# Patient Record
Sex: Female | Born: 1945 | Race: White | Hispanic: No | Marital: Married | Smoking: Never smoker
Health system: Southern US, Community
[De-identification: ages and names within clinical notes are randomized; demographics above are authoritative.]

## PROBLEM LIST (undated history)

## (undated) DIAGNOSIS — M1990 Unspecified osteoarthritis, unspecified site: Secondary | ICD-10-CM

## (undated) DIAGNOSIS — D649 Anemia, unspecified: Secondary | ICD-10-CM

## (undated) DIAGNOSIS — R760 Raised antibody titer: Secondary | ICD-10-CM

## (undated) DIAGNOSIS — K4090 Unilateral inguinal hernia, without obstruction or gangrene, not specified as recurrent: Principal | ICD-10-CM

## (undated) DIAGNOSIS — Z86718 Personal history of other venous thrombosis and embolism: Secondary | ICD-10-CM

## (undated) HISTORY — DX: Anemia, unspecified: D64.9

## (undated) HISTORY — PX: FOOT SURGERY: SHX648

## (undated) HISTORY — DX: Personal history of other venous thrombosis and embolism: Z86.718

## (undated) HISTORY — DX: Unilateral inguinal hernia, without obstruction or gangrene, not specified as recurrent: K40.90

## (undated) HISTORY — PX: TUBAL LIGATION: SHX77

## (undated) HISTORY — DX: Unspecified osteoarthritis, unspecified site: M19.90

## (undated) HISTORY — DX: Raised antibody titer: R76.0

---

## 1999-06-23 ENCOUNTER — Other Ambulatory Visit: Admission: RE | Admit: 1999-06-23 | Discharge: 1999-06-23 | Payer: Self-pay | Admitting: Family Medicine

## 2000-07-06 ENCOUNTER — Encounter: Admission: RE | Admit: 2000-07-06 | Discharge: 2000-07-06 | Payer: Self-pay | Admitting: Family Medicine

## 2000-07-06 ENCOUNTER — Encounter: Payer: Self-pay | Admitting: Family Medicine

## 2000-08-12 ENCOUNTER — Other Ambulatory Visit: Admission: RE | Admit: 2000-08-12 | Discharge: 2000-08-12 | Payer: Self-pay | Admitting: Family Medicine

## 2000-10-14 ENCOUNTER — Observation Stay (HOSPITAL_COMMUNITY): Admission: AD | Admit: 2000-10-14 | Discharge: 2000-10-15 | Payer: Self-pay | Admitting: Internal Medicine

## 2001-08-11 ENCOUNTER — Encounter: Admission: RE | Admit: 2001-08-11 | Discharge: 2001-08-11 | Payer: Self-pay | Admitting: Internal Medicine

## 2001-08-11 ENCOUNTER — Encounter: Payer: Self-pay | Admitting: Internal Medicine

## 2002-03-19 ENCOUNTER — Other Ambulatory Visit: Admission: RE | Admit: 2002-03-19 | Discharge: 2002-03-19 | Payer: Self-pay | Admitting: Internal Medicine

## 2002-05-27 ENCOUNTER — Encounter: Payer: Self-pay | Admitting: Emergency Medicine

## 2002-05-27 ENCOUNTER — Emergency Department (HOSPITAL_COMMUNITY): Admission: EM | Admit: 2002-05-27 | Discharge: 2002-05-27 | Payer: Self-pay | Admitting: Emergency Medicine

## 2002-08-17 ENCOUNTER — Encounter: Admission: RE | Admit: 2002-08-17 | Discharge: 2002-08-17 | Payer: Self-pay | Admitting: Internal Medicine

## 2002-08-17 ENCOUNTER — Encounter: Payer: Self-pay | Admitting: Internal Medicine

## 2004-03-13 ENCOUNTER — Encounter: Admission: RE | Admit: 2004-03-13 | Discharge: 2004-03-13 | Payer: Self-pay | Admitting: Internal Medicine

## 2005-02-04 ENCOUNTER — Ambulatory Visit: Payer: Self-pay | Admitting: Internal Medicine

## 2005-04-28 ENCOUNTER — Ambulatory Visit: Payer: Self-pay | Admitting: Internal Medicine

## 2005-04-30 ENCOUNTER — Ambulatory Visit: Payer: Self-pay | Admitting: Internal Medicine

## 2005-05-04 ENCOUNTER — Ambulatory Visit: Payer: Self-pay | Admitting: Internal Medicine

## 2005-09-03 ENCOUNTER — Encounter: Admission: RE | Admit: 2005-09-03 | Discharge: 2005-09-03 | Payer: Self-pay | Admitting: Internal Medicine

## 2005-09-21 ENCOUNTER — Ambulatory Visit (HOSPITAL_COMMUNITY): Admission: RE | Admit: 2005-09-21 | Discharge: 2005-09-21 | Payer: Self-pay | Admitting: Orthopedic Surgery

## 2005-09-21 ENCOUNTER — Ambulatory Visit (HOSPITAL_BASED_OUTPATIENT_CLINIC_OR_DEPARTMENT_OTHER): Admission: RE | Admit: 2005-09-21 | Discharge: 2005-09-21 | Payer: Self-pay | Admitting: Orthopedic Surgery

## 2005-09-21 ENCOUNTER — Encounter (INDEPENDENT_AMBULATORY_CARE_PROVIDER_SITE_OTHER): Payer: Self-pay | Admitting: *Deleted

## 2005-10-25 ENCOUNTER — Ambulatory Visit: Payer: Self-pay | Admitting: Internal Medicine

## 2005-11-08 ENCOUNTER — Encounter: Payer: Self-pay | Admitting: Internal Medicine

## 2005-11-08 ENCOUNTER — Other Ambulatory Visit: Admission: RE | Admit: 2005-11-08 | Discharge: 2005-11-08 | Payer: Self-pay | Admitting: Internal Medicine

## 2005-11-08 ENCOUNTER — Ambulatory Visit: Payer: Self-pay | Admitting: Internal Medicine

## 2005-12-08 ENCOUNTER — Ambulatory Visit: Payer: Self-pay | Admitting: Family Medicine

## 2005-12-16 ENCOUNTER — Ambulatory Visit: Payer: Self-pay | Admitting: Internal Medicine

## 2005-12-29 ENCOUNTER — Encounter (INDEPENDENT_AMBULATORY_CARE_PROVIDER_SITE_OTHER): Payer: Self-pay | Admitting: Specialist

## 2005-12-29 ENCOUNTER — Encounter: Payer: Self-pay | Admitting: Internal Medicine

## 2005-12-29 ENCOUNTER — Ambulatory Visit: Payer: Self-pay | Admitting: Internal Medicine

## 2006-05-09 ENCOUNTER — Ambulatory Visit: Payer: Self-pay | Admitting: Internal Medicine

## 2006-08-16 ENCOUNTER — Ambulatory Visit: Payer: Self-pay | Admitting: Internal Medicine

## 2006-09-30 ENCOUNTER — Ambulatory Visit: Payer: Self-pay | Admitting: Internal Medicine

## 2006-09-30 LAB — CONVERTED CEMR LAB
ALT: 18 units/L (ref 0–40)
AST: 16 units/L (ref 0–37)
Albumin: 3.4 g/dL — ABNORMAL LOW (ref 3.5–5.2)
Alkaline Phosphatase: 61 units/L (ref 39–117)
BUN: 13 mg/dL (ref 6–23)
Basophils Absolute: 0 10*3/uL (ref 0.0–0.1)
Basophils Relative: 0.7 % (ref 0.0–1.0)
CO2: 31 meq/L (ref 19–32)
Calcium: 9 mg/dL (ref 8.4–10.5)
Chloride: 106 meq/L (ref 96–112)
Chol/HDL Ratio, serum: 3.1
Cholesterol: 185 mg/dL (ref 0–200)
Creatinine, Ser: 0.9 mg/dL (ref 0.4–1.2)
Eosinophil percent: 3.8 % (ref 0.0–5.0)
GFR calc non Af Amer: 68 mL/min
Glomerular Filtration Rate, Af Am: 82 mL/min/{1.73_m2}
Glucose, Bld: 79 mg/dL (ref 70–99)
HCT: 35.6 % — ABNORMAL LOW (ref 36.0–46.0)
HDL: 58.9 mg/dL (ref >39.0)
Hemoglobin: 11.4 g/dL — ABNORMAL LOW (ref 12.0–15.0)
LDL Cholesterol: 110 mg/dL — ABNORMAL HIGH (ref 0–99)
Lymphocytes Relative: 23.8 % (ref 12.0–46.0)
MCHC: 32.2 g/dL (ref 30.0–36.0)
MCV: 97.9 fL (ref 78.0–100.0)
Monocytes Absolute: 0.2 10*3/uL (ref 0.2–0.7)
Monocytes Relative: 6.6 % (ref 3.0–11.0)
Neutro Abs: 2.2 10*3/uL (ref 1.4–7.7)
Neutrophils Relative %: 65.1 % (ref 43.0–77.0)
Platelets: 203 10*3/uL (ref 150–400)
Potassium: 3.8 meq/L (ref 3.5–5.1)
RBC: 3.63 M/uL — ABNORMAL LOW (ref 3.87–5.11)
RDW: 15.6 % — ABNORMAL HIGH (ref 11.5–14.6)
Sodium: 142 meq/L (ref 135–145)
TSH: 3.89 microintl units/mL (ref 0.35–5.50)
Total Bilirubin: 0.6 mg/dL (ref 0.3–1.2)
Total Protein: 6.7 g/dL (ref 6.0–8.3)
Triglyceride fasting, serum: 81 mg/dL (ref 0–149)
VLDL: 16 mg/dL (ref 0–40)
WBC: 3.3 10*3/uL — ABNORMAL LOW (ref 4.5–10.5)

## 2006-10-18 ENCOUNTER — Encounter: Admission: RE | Admit: 2006-10-18 | Discharge: 2006-10-18 | Payer: Self-pay | Admitting: Internal Medicine

## 2006-11-18 ENCOUNTER — Ambulatory Visit: Payer: Self-pay | Admitting: Internal Medicine

## 2006-12-01 ENCOUNTER — Ambulatory Visit: Payer: Self-pay | Admitting: Internal Medicine

## 2006-12-26 ENCOUNTER — Ambulatory Visit: Payer: Self-pay | Admitting: Internal Medicine

## 2006-12-26 LAB — CONVERTED CEMR LAB
AntiThromb III Func: 121 % — ABNORMAL HIGH (ref 75–120)
Anticardiolipin IgA: 29 (ref <13)
Anticardiolipin IgG: <7 (ref <11)
Anticardiolipin IgM: <7 (ref <10)
Homocysteine: 9.1 micromoles/L (ref 4.0–15.4)
Protein C Activity: 116 % (ref 77–173)
Protein S Activity: 76 % (ref 57–131)
Protein S Ag, Total: 138 % (ref 70–140)

## 2007-04-03 ENCOUNTER — Encounter: Payer: Self-pay | Admitting: Internal Medicine

## 2007-04-27 ENCOUNTER — Encounter: Payer: Self-pay | Admitting: Internal Medicine

## 2007-05-09 ENCOUNTER — Ambulatory Visit (HOSPITAL_BASED_OUTPATIENT_CLINIC_OR_DEPARTMENT_OTHER): Admission: RE | Admit: 2007-05-09 | Discharge: 2007-05-09 | Payer: Self-pay | Admitting: Orthopedic Surgery

## 2007-07-04 ENCOUNTER — Telehealth: Payer: Self-pay | Admitting: Internal Medicine

## 2007-07-05 ENCOUNTER — Ambulatory Visit: Payer: Self-pay | Admitting: Internal Medicine

## 2007-07-05 DIAGNOSIS — M8580 Other specified disorders of bone density and structure, unspecified site: Secondary | ICD-10-CM

## 2007-07-05 DIAGNOSIS — Z86718 Personal history of other venous thrombosis and embolism: Secondary | ICD-10-CM

## 2007-07-05 DIAGNOSIS — M810 Age-related osteoporosis without current pathological fracture: Secondary | ICD-10-CM | POA: Insufficient documentation

## 2007-09-21 ENCOUNTER — Telehealth (INDEPENDENT_AMBULATORY_CARE_PROVIDER_SITE_OTHER): Payer: Self-pay | Admitting: *Deleted

## 2007-09-25 ENCOUNTER — Ambulatory Visit: Payer: Self-pay | Admitting: Internal Medicine

## 2007-09-25 LAB — CONVERTED CEMR LAB
INR: 0.8 (ref 0.8–1.0)
Prothrombin Time: 10.9 s (ref 10.9–13.3)
aPTT: 29.3 s (ref 21.7–29.8)

## 2007-09-27 ENCOUNTER — Telehealth: Payer: Self-pay | Admitting: Internal Medicine

## 2007-09-28 ENCOUNTER — Ambulatory Visit: Payer: Self-pay | Admitting: Internal Medicine

## 2007-09-29 LAB — CONVERTED CEMR LAB
INR: 0.8 (ref 0.8–1.0)
Prothrombin Time: 10.3 s — ABNORMAL LOW (ref 10.9–13.3)

## 2007-10-02 ENCOUNTER — Telehealth: Payer: Self-pay | Admitting: Internal Medicine

## 2007-10-02 ENCOUNTER — Ambulatory Visit: Payer: Self-pay | Admitting: Internal Medicine

## 2007-10-02 LAB — CONVERTED CEMR LAB
INR: 0.7 — ABNORMAL LOW (ref 0.8–1.0)
Prothrombin Time: 10.1 s — ABNORMAL LOW (ref 10.9–13.3)

## 2007-10-04 ENCOUNTER — Ambulatory Visit: Payer: Self-pay | Admitting: Internal Medicine

## 2007-10-04 LAB — CONVERTED CEMR LAB
INR: 0.8
Prothrombin Time: 10.7 s

## 2007-10-06 ENCOUNTER — Ambulatory Visit: Payer: Self-pay | Admitting: Internal Medicine

## 2007-10-09 ENCOUNTER — Ambulatory Visit: Payer: Self-pay | Admitting: Internal Medicine

## 2007-10-09 LAB — CONVERTED CEMR LAB
INR: 1.5
Prothrombin Time: 15.3 s

## 2007-10-16 ENCOUNTER — Telehealth: Payer: Self-pay | Admitting: Internal Medicine

## 2007-10-16 ENCOUNTER — Ambulatory Visit: Payer: Self-pay | Admitting: Internal Medicine

## 2007-10-16 LAB — CONVERTED CEMR LAB
INR: 3.1
Prothrombin Time: 21.1 s

## 2007-10-25 ENCOUNTER — Ambulatory Visit: Payer: Self-pay | Admitting: Internal Medicine

## 2007-10-25 LAB — CONVERTED CEMR LAB
INR: 2.8
Prothrombin Time: 20.4 s

## 2007-11-22 ENCOUNTER — Ambulatory Visit: Payer: Self-pay | Admitting: Internal Medicine

## 2007-11-22 LAB — CONVERTED CEMR LAB
INR: 2.4
Prothrombin Time: 18.7 s

## 2007-12-06 ENCOUNTER — Encounter: Admission: RE | Admit: 2007-12-06 | Discharge: 2007-12-06 | Payer: Self-pay | Admitting: Internal Medicine

## 2007-12-12 ENCOUNTER — Telehealth: Payer: Self-pay | Admitting: Internal Medicine

## 2007-12-13 ENCOUNTER — Telehealth: Payer: Self-pay | Admitting: Internal Medicine

## 2007-12-29 ENCOUNTER — Ambulatory Visit: Payer: Self-pay | Admitting: Internal Medicine

## 2007-12-29 LAB — CONVERTED CEMR LAB
INR: 2.6
Prothrombin Time: 19.5 s

## 2008-01-04 ENCOUNTER — Encounter: Payer: Self-pay | Admitting: Internal Medicine

## 2008-02-02 ENCOUNTER — Ambulatory Visit: Payer: Self-pay | Admitting: Internal Medicine

## 2008-02-02 LAB — CONVERTED CEMR LAB
INR: 2.8
Prothrombin Time: 20.4 s

## 2008-02-19 ENCOUNTER — Encounter: Payer: Self-pay | Admitting: Internal Medicine

## 2008-03-01 ENCOUNTER — Ambulatory Visit: Payer: Self-pay | Admitting: Internal Medicine

## 2008-03-01 LAB — CONVERTED CEMR LAB: Prothrombin Time: 18.8 s

## 2008-03-05 ENCOUNTER — Telehealth: Payer: Self-pay | Admitting: Internal Medicine

## 2008-04-05 ENCOUNTER — Ambulatory Visit: Payer: Self-pay | Admitting: Internal Medicine

## 2008-05-10 ENCOUNTER — Ambulatory Visit: Payer: Self-pay | Admitting: Internal Medicine

## 2008-05-10 LAB — CONVERTED CEMR LAB: Prothrombin Time: 16.9 s

## 2008-06-14 ENCOUNTER — Ambulatory Visit: Payer: Self-pay | Admitting: Internal Medicine

## 2008-06-14 LAB — CONVERTED CEMR LAB
INR: 2.2
Prothrombin Time: 18.2 s

## 2008-07-11 ENCOUNTER — Telehealth: Payer: Self-pay | Admitting: Internal Medicine

## 2008-07-15 ENCOUNTER — Telehealth: Payer: Self-pay | Admitting: Internal Medicine

## 2009-01-17 ENCOUNTER — Ambulatory Visit: Payer: Self-pay | Admitting: Internal Medicine

## 2009-01-17 ENCOUNTER — Encounter: Admission: RE | Admit: 2009-01-17 | Discharge: 2009-01-17 | Payer: Self-pay | Admitting: Internal Medicine

## 2009-01-23 ENCOUNTER — Encounter: Admission: RE | Admit: 2009-01-23 | Discharge: 2009-01-23 | Payer: Self-pay | Admitting: Internal Medicine

## 2009-06-04 ENCOUNTER — Ambulatory Visit: Payer: Self-pay | Admitting: Internal Medicine

## 2009-06-04 DIAGNOSIS — M359 Systemic involvement of connective tissue, unspecified: Secondary | ICD-10-CM | POA: Insufficient documentation

## 2009-06-10 ENCOUNTER — Telehealth (INDEPENDENT_AMBULATORY_CARE_PROVIDER_SITE_OTHER): Payer: Self-pay | Admitting: *Deleted

## 2009-08-13 ENCOUNTER — Telehealth: Payer: Self-pay | Admitting: Internal Medicine

## 2010-02-10 ENCOUNTER — Encounter: Admission: RE | Admit: 2010-02-10 | Discharge: 2010-02-10 | Payer: Self-pay | Admitting: Internal Medicine

## 2010-07-29 ENCOUNTER — Ambulatory Visit: Payer: Self-pay | Admitting: Family Medicine

## 2010-07-29 DIAGNOSIS — R030 Elevated blood-pressure reading, without diagnosis of hypertension: Secondary | ICD-10-CM

## 2010-07-29 LAB — CONVERTED CEMR LAB
Ketones, urine, test strip: NEGATIVE
Nitrite: POSITIVE
Urobilinogen, UA: 0.2
pH: 7.5

## 2010-07-30 ENCOUNTER — Encounter: Payer: Self-pay | Admitting: Internal Medicine

## 2010-08-19 ENCOUNTER — Ambulatory Visit: Payer: Self-pay | Admitting: Internal Medicine

## 2010-08-19 LAB — CONVERTED CEMR LAB
BUN: 11 mg/dL (ref 6–23)
Basophils Relative: 1 % (ref 0.0–3.0)
Bilirubin Urine: NEGATIVE
CO2: 30 meq/L (ref 19–32)
Chloride: 108 meq/L (ref 96–112)
Cholesterol: 190 mg/dL (ref 0–200)
Eosinophils Absolute: 0.2 10*3/uL (ref 0.0–0.7)
Eosinophils Relative: 4.7 % (ref 0.0–5.0)
Glucose, Bld: 82 mg/dL (ref 70–99)
HCT: 34.3 % — ABNORMAL LOW (ref 36.0–46.0)
HDL: 59.8 mg/dL (ref >39.00)
Hemoglobin, Urine: NEGATIVE
Ketones, ur: NEGATIVE mg/dL
Leukocytes, UA: NEGATIVE
Lymphocytes Relative: 35.2 % (ref 12.0–46.0)
Lymphs Abs: 1.3 10*3/uL (ref 0.7–4.0)
MCHC: 33.8 g/dL (ref 30.0–36.0)
MCV: 97.3 fL (ref 78.0–100.0)
Neutrophils Relative %: 44.5 % (ref 43.0–77.0)
Nitrite: NEGATIVE
Potassium: 4.4 meq/L (ref 3.5–5.1)
RBC: 3.53 M/uL — ABNORMAL LOW (ref 3.87–5.11)
Specific Gravity, Urine: 1.01 (ref 1.000–1.030)
TSH: 3.43 microintl units/mL (ref 0.35–5.50)
Total Bilirubin: 0.4 mg/dL (ref 0.3–1.2)
Total CHOL/HDL Ratio: 3
Urine Glucose: NEGATIVE mg/dL
WBC: 3.7 10*3/uL — ABNORMAL LOW (ref 4.5–10.5)
pH: 7.5 (ref 5.0–8.0)

## 2010-09-11 ENCOUNTER — Other Ambulatory Visit: Admission: RE | Admit: 2010-09-11 | Discharge: 2010-09-11 | Payer: Self-pay | Admitting: Internal Medicine

## 2010-09-11 ENCOUNTER — Ambulatory Visit: Payer: Self-pay | Admitting: Internal Medicine

## 2010-09-15 LAB — CONVERTED CEMR LAB: Pap Smear: NEGATIVE

## 2010-12-27 ENCOUNTER — Encounter: Payer: Self-pay | Admitting: Internal Medicine

## 2011-01-07 NOTE — Letter (Signed)
Summary: [facility]  [facility]   Imported By: Sherian Rein 05/16/2010 10:33:15  _____________________________________________________________________  External Attachment:    Type:   Image     Comment:   External Document

## 2011-01-07 NOTE — Assessment & Plan Note (Signed)
Summary: Uti/dm   Vital Signs:  Patient profile:   65 year old female Height:      67.5 inches (171.45 cm) Weight:      163 pounds (74.09 kg) BMI:     25.24 O2 Sat:      92 % on Room air Temp:     98.1 degrees F (36.72 degrees C) oral Pulse rate:   88 / minute BP sitting:   148 / 88  (left arm)  Vitals Entered By: Josph Macho RMA (July 29, 2010 9:31 AM)  O2 Flow:  Room air  Serial Vital Signs/Assessments:  Time      Position  BP       Pulse  Resp  Temp     By                     140/82                         Danise Edge MD  CC: UTI/ CF Is Patient Diabetic? No   History of Present Illness: Patient in today for evaluation of some urinary symptoms. About 10 days ago she had one day with increased urinary frequency but then for the next week she felt fine. A couple of days ago she noted increased urinary urgency and frequency again and this time it is persistent. She also notes an increased odor and some mild vaginal itching. She denies any vaginal discharge/lesions/malaise/myalgias/dysuria/hematuria/abd pain/back pain/f/c/anorexia or GI c/o. No HA/CP/palp.  Current Medications (verified): 1)  Alendronate Sodium 70 Mg  Tabs (Alendronate Sodium) .Marland Kitchen.. 1 By Mouth Q Week . Take With 8 Ounces of Water On and Empty Stomach 2)  Prednisone 4 Mg  Tabs (Prednisone) .... One Daily 3)  Adult Aspirin Ec Low Strength 81 Mg  Tbec (Aspirin) .... One By Mouth Daily 4)  Calcium 1500 Mg Tabs (Calcium Carbonate) .... Once Daily 5)  Vitamin D 1000 Unit Caps (Cholecalciferol) .... Once Daily  Allergies: 1)  ! Augmentin 2)  ! Plaquenil  Past History:  Past medical history reviewed for relevance to current acute and chronic problems. Social history (including risk factors) reviewed for relevance to current acute and chronic problems.  Past Medical History: Reviewed history from 07/05/2007 and no changes required. 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 Occupation: Married Never Smoked Regular exercise-no  Review of Systems      See HPI  Physical Exam  General:  Well-developed,well-nourished,in no acute distress; alert,appropriate and cooperative throughout examination Head:  Normocephalic and atraumatic without obvious abnormalities. No apparent alopecia or balding. Lungs:  Normal respiratory effort, chest expands symmetrically. Lungs are clear to auscultation, no crackles or wheezes. Heart:  Normal rate and regular rhythm. S1 and S2 normal without gallop, murmur, click, rub or other extra sounds. Abdomen:  Bowel sounds positive,abdomen soft and non-tender without masses, organomegaly or hernias noted. Msk:  No deformity or scoliosis noted of thoracic or lumbar spine.  No flank tenderness Extremities:  No clubbing, cyanosis, edema, or deformity noted  Skin:  Intact without suspicious lesions or rashes Psych:  Cognition and judgment appear intact. Alert and cooperative with normal attention span and concentration. No apparent delusions, illusions, hallucinations   Impression & Recommendations:  Problem # 1:  ACUTE CYSTITIS (ICD-595.0)  Her updated medication list for this problem includes:    Cipro 250 Mg Tabs (  Ciprofloxacin hcl) .Marland Kitchen... 1 tab by mouth two times a day  Orders: T-Urine Culture (Spectrum Order) [id number]) Maintain increased hydration  Problem # 2:  ELEVATED BP READING WITHOUT DX HYPERTENSION (ICD-796.2) Has not slept well for several nights due to busy calls for work and did have to rush in here. She will have the nurse at work monitor her BP intermittently and let us know if her systolic bp continues to run above 135. She is advised to get 7-8 hours of sleep each night if possible. Avoid heavy sodium and caffeine use.  Complete Medication List: 1)  Alendronate Sodium 70 Mg Tabs  (Alendronate sodium) .Marland Kitchen.. 1 by mouth q week . take with 8 ounces of water on and empty stomach 2)  Prednisone 4 Mg Tabs (prednisone)  .... One daily 3)  Adult Aspirin Ec Low Strength 81 Mg Tbec (Aspirin) .... One by mouth daily 4)  Calcium 1500 Mg Tabs (Calcium carbonate) .... Once daily 5)  Vitamin D 1000 Unit Caps (Cholecalciferol) .... Once daily 6)  Cipro 250 Mg Tabs (Ciprofloxacin hcl) .Marland Kitchen.. 1 tab by mouth two times a day  Patient Instructions: 1)  Drink plenty of fluids up to 3-4 quarts a day. Cranberry juice is especially recommended in addition to large amounts of water. Avoid caffeine & carbonated drinks, they tend to irritate the bladder, Return in 3-5 days if you're not better: sooner if you're feeling worse.  2)  Take Ciprofloxacin two times a day for 3 days if symptoms completely resolved, may stop. If any symptoms persist take entire 7 day course of meds. Prescriptions: CIPRO 250 MG TABS (CIPROFLOXACIN HCL) 1 tab by mouth two times a day  #14 x 0   Entered and Authorized by:   Danise Edge MD   Signed by:   Danise Edge MD on 07/29/2010   Method used:   Print then Give to Patient   RxID:   [id number]   Laboratory Results   Urine Tests  Date/Time Received: July 29, 2010  Date/Time Reported: July 29, 2010   Routine Urinalysis   Color: yellow Appearance: Clear Glucose: negative   (Normal Range: Negative) Bilirubin: negative   (Normal Range: Negative) Ketone: negative   (Normal Range: Negative) Spec. Gravity: <1.005   (Normal Range: 1.003-1.035) Blood: trace-lysed   (Normal Range: Negative) pH: 7.5   (Normal Range: 5.0-8.0) Protein: 30   (Normal Range: Negative) Urobilinogen: 0.2   (Normal Range: 0-1) Nitrite: positive   (Normal Range: Negative) Leukocyte Esterace: large   (Normal Range: Negative)    Comments: Josph Macho RMA  July 29, 2010 9:53 AM

## 2011-01-07 NOTE — Assessment & Plan Note (Signed)
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 1 By Mouth Q Week . Take With 8 Ounces of Water On and Empty Stomach 2)  Prednisone 2 1/2 Mg  Tabs (Prednisone) .... One Daily 3)  Adult Aspirin Ec Low Strength 81 Mg  Tbec (Aspirin) .... One By Mouth Daily 4)  Calcium 1500 Mg Tabs (Calcium Carbonate) .... Once Daily 5)  Vitamin D 1000 Unit Caps (Cholecalciferol) .... Once Daily 6)  Cipro 250 Mg Tabs (Ciprofloxacin Hcl) .Marland Kitchen.. 1 Tab By Mouth Two Times A Day  Allergies: 1)  ! Augmentin 2)  ! Plaquenil  Past History:  Past Medical History: Last updated: 2007/07/12 lupus erythematosis-leukocytoclastic vasculitis DVT--lupus anticoagulant SLE Angioedema Osteopenia DVT, hx of  Past Surgical History: Last updated: 2007-07-12 bunionectomy, hammer toe closed reduction wrist fracture Inguinal herniorrhaphy  Family History: Last updated: Jul 12, 2007 father deceased- CAD-57yo mother deceased 70 yo---probable MI vs PE  Social History: Last updated: 12-Jul-2007 Occupation: Married Never Smoked Regular exercise-no  Risk Factors: Exercise: no (07/12/07)  Risk Factors: Smoking Status: never (07/12/2007)   Impression & Recommendations:  Problem # 1:  PREVENTIVE HEALTH CARE  (ICD-V70.0) Health Maint UTD  Problem # 2:  ELEVATED BP READING WITHOUT DX HYPERTENSION (ICD-796.2) continue to monitor BP today: 142/82 Prior BP: 148/88 (07/29/2010)  Labs Reviewed: Creat: 0.8 (08/19/2010) Chol: 190 (08/19/2010)   HDL: 59.80 (08/19/2010)   LDL: 114 (08/19/2010)   TG: 80.0 (08/19/2010)  Instructed in low sodium diet (DASH Handout) and behavior modification.    Complete Medication List: 1)  Alendronate Sodium 70 Mg Tabs (Alendronate sodium) .Marland Kitchen.. 1 by mouth q week . take with 8 ounces of water on and empty stomach 2)  Prednisone 2.5 Mg Tabs (Prednisone) 3)  Adult Aspirin Ec Low Strength 81 Mg Tbec (Aspirin) .... One by mouth daily 4)  Calcium 1500 Mg Tabs (Calcium carbonate) .... Once daily 5)  Vitamin D 1000 Unit Caps (Cholecalciferol) .... Once daily Physical Exam General Appearance: well developed, well nourished, no acute distress Eyes: conjunctiva and lids normal, PERRL, EOMI,  Ears, Nose, Mouth, Throat: TM clear, nares clear, oral exam WNL Neck: supple, no lymphadenopathy, no thyromegaly, no JVD Respiratory: clear to auscultation and percussion, respiratory effort normal Cardiovascular: regular rate and rhythm, S1-S2, no murmur, rub or gallop, no bruits, peripheral pulses normal and symmetric, no cyanosis, clubbing, edema or varicosities Chest: no scars, masses, tenderness; no asymmetry, skin changes, nipple discharge   Gastrointestinal: soft, non-tender; no hepatosplenomegaly, masses; active bowel sounds all quadrants, ; no masses, tenderness, hemorrhoids  Genitourinary: no vaginal discharge, lesions; no masses or tenderness, PAP done Lymphatic: no cervical, axillary or inguinal adenopathy  Musculoskeletal: gait normal, muscle tone and strength WNL, no joint swelling, effusions, discoloration, crepitus  Skin: clear, good turgor, color WNL, no rashes, lesions, or ulcerations Neurologic: normal mental status, normal reflexes, normal strength, sensation, and  motion Psychiatric: alert; oriented to person, place and time Other Exam:   Appended Document: CPX W/ PAP // RS---PT [facility] // RS     Clinical Lists Changes  Orders: Added new Service order of Tdap => 69yrs IM (16109) - Signed Added new Service order of Admin 1st Vaccine (60454) - Signed Observations: Added new observation of TD BOOST VIS: 10/23/08 version given September 11, 2010. (09/11/2010 9:01) Added new observation of TD BOOSTERLO: [id number] (09/11/2010 9:01) Added new observation of TD BOOST EXP: 09/24/2012 (09/11/2010 9:01) Added new observation of TD BOOSTERBY: Sid Falcon LPN (78/29/5621 9:01) Added new observation of TD BOOSTERRT: IM (09/11/2010 9:01) Added new observation of TDBOOSTERDSE: 0.5 ml (09/11/2010 9:01) Added new observation of TD BOOSTERMF: GlaxoSmithK[employer] (09/11/2010 9:01) Added new observation of TD BOOST SIT: left deltoid (09/11/2010 9:01) Added new observation of TD BOOSTER: Tdap (09/11/2010 9:01)       Immunizations Administered:  Tetanus Vaccine:    Vaccine Type: Tdap    Site: left deltoid    Mfr: GlaxoSmithKline    Dose: 0.5 ml    Route: IM    Given by: Sid Falcon LPN    Exp. Date: 09/24/2012    Lot #: [id number]    VIS given: 10/23/08 version given September 11, 2010.

## 2011-01-07 NOTE — Procedures (Signed)
Summary: Colonoscopy/[facility]  Colonoscopy/[facility]   Imported By: Lanelle Bal 09/11/2010 08:54:04  _____________________________________________________________________  External Attachment:    Type:   Image     Comment:   External Document

## 2011-02-15 ENCOUNTER — Other Ambulatory Visit: Payer: Self-pay | Admitting: Internal Medicine

## 2011-02-15 DIAGNOSIS — M8580 Other specified disorders of bone density and structure, unspecified site: Secondary | ICD-10-CM

## 2011-02-16 ENCOUNTER — Other Ambulatory Visit: Payer: Self-pay | Admitting: Internal Medicine

## 2011-02-16 ENCOUNTER — Ambulatory Visit (INDEPENDENT_AMBULATORY_CARE_PROVIDER_SITE_OTHER)
Admission: RE | Admit: 2011-02-16 | Discharge: 2011-02-16 | Disposition: A | Discharge status: Home / Self Care | Payer: BC Managed Care – PPO | Source: Ambulatory Visit | Attending: Family Medicine | Admitting: Family Medicine

## 2011-02-16 DIAGNOSIS — M949 Disorder of cartilage, unspecified: Secondary | ICD-10-CM

## 2011-02-16 DIAGNOSIS — M8580 Other specified disorders of bone density and structure, unspecified site: Secondary | ICD-10-CM

## 2011-02-24 ENCOUNTER — Telehealth: Payer: Self-pay | Admitting: Internal Medicine

## 2011-02-24 NOTE — Telephone Encounter (Signed)
Get result

## 2011-02-24 NOTE — Telephone Encounter (Signed)
Pt called re: bone density test. Pt says she needs results asap, because it is time to re-order the Fosomax and if bone density ok, she will not need to re-order. Pls call asap.

## 2011-02-25 NOTE — Telephone Encounter (Signed)
Results will not be back till next week l/m for pt to call back

## 2011-03-05 ENCOUNTER — Telehealth: Payer: Self-pay | Admitting: *Deleted

## 2011-03-05 NOTE — Telephone Encounter (Signed)
Pt had a normal DEXA scan and she is currently taking fosamax.  She wants to know if she can quit taking the Fosamax

## 2011-03-05 NOTE — Telephone Encounter (Signed)
Ok to d/c fosomax

## 2011-03-08 NOTE — Telephone Encounter (Signed)
No answer, no machine.

## 2011-03-09 ENCOUNTER — Encounter: Payer: Self-pay | Admitting: Internal Medicine

## 2011-03-09 NOTE — Telephone Encounter (Signed)
No answer, no machine.

## 2011-03-11 ENCOUNTER — Encounter: Payer: Self-pay | Admitting: *Deleted

## 2011-03-11 ENCOUNTER — Other Ambulatory Visit: Payer: Self-pay | Admitting: Internal Medicine

## 2011-03-11 DIAGNOSIS — Z1231 Encounter for screening mammogram for malignant neoplasm of breast: Secondary | ICD-10-CM

## 2011-03-11 NOTE — Telephone Encounter (Signed)
Can not get a hold of pt to give Dr Cato Mulligan instructions, mailed letter

## 2011-03-19 ENCOUNTER — Ambulatory Visit
Admission: RE | Admit: 2011-03-19 | Discharge: 2011-03-19 | Disposition: A | Discharge status: Home / Self Care | Payer: BC Managed Care – PPO | Source: Ambulatory Visit | Attending: Internal Medicine | Admitting: Internal Medicine

## 2011-03-19 DIAGNOSIS — Z1231 Encounter for screening mammogram for malignant neoplasm of breast: Secondary | ICD-10-CM

## 2011-04-22 ENCOUNTER — Telehealth: Payer: Self-pay | Admitting: *Deleted

## 2011-04-22 NOTE — Telephone Encounter (Addendum)
Pt has symptoms of urine odor, nocturia, no pain or fever.   Multi stick: Nitrate positve Leukocytes 3 + [facility]

## 2011-04-23 MED ORDER — CIPROFLOXACIN HCL 500 MG PO TABS: 250.0000 mg | ORAL_TABLET | Freq: Two times a day (BID) | ORAL | Status: DC

## 2011-04-23 NOTE — Op Note (Signed)
NAMEDEAUNNA, OLARTE         ACCOUNT NO.:  [id number]   MEDICAL RECORD NO.:  [id number]          PATIENT TYPE:  AMB   LOCATION:  DSC                          FACILITY:  [facility]   PHYSICIAN:  Leonides Grills, M.D.     DATE OF BIRTH:  1946/03/23   DATE OF PROCEDURE:  05/09/2007  DATE OF DISCHARGE:                               OPERATIVE REPORT   PREOPERATIVE DIAGNOSES:  1. Right hypermobile first ray.  2. Right hallux valgus.  3. Right second metatarsalgia.  4. Right second hammertoe.   POSTOPERATIVE DIAGNOSES:  1. Right hypermobile first ray.  2. Right hallux valgus.  3. Right second metatarsalgia.  4. Right second hammertoe.   OPERATIONS:  1. Right first tarsometatarsal joint fusion with osteotomy.  2. Right local bone graft.  3. Stress x-rays, right foot.  4. Right modified McBride bunionectomy.  5. Right second Weil metatarsal shortening osteotomy.  6. Right second toe metatarsophalangeal joint dorsal capsulotomy with      collateral release.  7. Right second toe proximal phalanx head resection.  8. Right second toe extensor digitorum brevis to extensor digitorum      longus tendon transfer.  9. Right second toe flexor digitorum longus to proximal phalanx tendon      transfer.   ANESTHESIA:  General.   SURGEON:  Leonides Grills, MD.   ASSISTANT:  Evlyn Kanner, PA-C.   ESTIMATED BLOOD LOSS:  Minimal   TOURNIQUET TIME:  Approximately 2 hours.   COMPLICATIONS:  None.   DISPOSITION:  Stable to PAR.   INDICATIONS:  This is a 65 year old female who has had longstanding  right forefoot pain secondary to the above deformities.  She was  consented for the above procedure all risks which include infection,  neurovascular injury, nonunion, malunion, hardware rotation, hardware  failure, persistent worse pain, prolonged recovery, stiffness,  arthritis, recurrence of deformity, hallux varus, cock-up toe deformity;  were all explained.  Questions were encouraged and  answered.   OPERATION:  Patient was brought to the operating room and placed in the  supine position after adequate general endotracheal tube anesthesia was  administered as well as Ancef 1 gram IV piggyback.  The right lower  extremity was then prepped and draped in a sterile manner over a  proximally placed thigh tourniquet.  The limb was gravity exsanguinated,  and the tourniquet was elevated to 290 mmHg after the right lower  extremity was prepped and draped in sterile manner.   We started the procedure with a longitudinal midline over the medial  aspect of the right great toe MTP joint.  Dissection was carried down  through skin, hemostasis was obtained.  Neurovascular structures  identified both superiorly and inferiorly and protected throughout the  case.  L-shaped capsulotomy was then performed.  A simple bunionectomy  was then performed with a sagittal saw.  Alona Bene ridge was then  rounded off with the rongeur.  All this bone was placed on the back  table as local bone graft.   We then went back to the dorsal aspect of the foot, and in the interval  between EHL and EHB, an incision  was then made.  Dissection was carried  down through skin.  Hemostasis was obtained.  This incision extended all  the way down to the lateral aspect of the great toe MTP joint.  Neurovascular structures were identified and retracted out of harm's  way.  The interval between EHL and EHB was then developed.  Both tendons  were protected in their respective sheaths and retracted out of harm's  way for the entire procedure as well as the neurovascular structures.  The periosteum was then elevated off the base of the first metatarsal as  well as the distal aspect of the medial cuneiform.  The first TMP joint  was then entered.  Osteotomy was then created, closing wedge type,  removing the lateral aspect, closing wedge of the distal portion of the  medial cuneiform.  This was then also placed on back  table as local bone  graft.  A portion of the lateral aspect of the base of the first  metatarsal was also removed, as well, and the edges of the first  metatarsal were also osteotomized to create perfect congruent surface.   Once this was done, multiple 2-mm drills were placed on either side of  the joint.  The bur was then used to create a notch at the base of the  first metatarsal.  The first TMP joint was then adequately reduced, and  this was visualized under C-arm guidance to be in excellent alignment  and adequately aligned.  We then placed a 3.5-mm fully threaded cortical  lag screw using 3.5-2.5 mm drill holes respectively; this had excellent  purchase and maintenance of the correction.  The 2-point reduction clamp  was removed.  Another lag screw was placed from the dorsal aspect of the  medial cuneiform into the base of the first metatarsal lateral to the  first screw.  Again this was a 3.5-mm fully threaded cortical lag screw  using a 3.5-2.5 mm drill hole respectively.  This had excellent purchase  and maintenance of the correction.  Stress x-ray were obtained in the AP  and lateral planes and showed no gross motion across the fusion site;  fixation and proposition and excellent alignment, as well.   We went through the distal aspect of the wound, extended this distally  to the first MTP joint.  The lateral capsule was then released as well  as the abductor tendon complex.  This allowed the sesamoids to reduce  underneath the first metatarsal.  This was visualized to be anatomically  reduced.   We then irrigated the joint and area copiously with normal saline.  We  advanced the capsule medially and reconstructed this with 2-0 Vicryl  stitch by advancing it superiorly and proximally.  This had an  outstanding repair, held the toe in excellent alignment and reduced the  sesamoids.  Range of motion of the great toe and MTP joint was  excellent.  We then made an incision on  the dorsal aspect of the second toe, and  dissection was carried down through skin, and hemostasis was obtained.  EDL and EDB were identified.  The EDL was tenotomized proximal medial  and the brevis distal lateral and retracted out of harm's way for later  transfer.  An MTP joint dorsal capsulotomy with collateral release was  then performed with a 15-blade scalpel, protecting the soft tissues both  medially and laterally.  Soft tissue was elevated and removed from the  dorsal aspect of the first metatarsal head area.  There was a large spur  over this area, and from the MTP joint being chronically dislocated,  there was actually a notch in the dorsal aspect of the metatarsal head.  The plantar plate was completely disrupted.  We then skeletonized the  distal aspect of the proximal phalanx.  Once this was carefully  dissected out, the head was then removed with a rongeur and a bone  cutter.  Longitudinal incision was made into the plantar plate of the  PIP joint.  FDL tendon was then identified, pulled through the wound and  tenotomized distal as possible for later transfer.   We then plantar flexed the second toe far as we could and then performed  a Weil metatarsal shortening osteotomy in line with the plantar aspect  of the foot parallel to the plantar aspect of the foot.  The head was  then translated approximately 2-3 mm proximally and fixed with a 1.5-mm  fully threaded cortical set screw using a 1.1-mm drill hole  respectively.  This had excellent purchase and maintenance of the  correction.  The redundant bone ledge dorsally was then trimmed off with  a rongeur.  Bone wax was applied to all bony exposed surfaces.  The head  of the screw was countersunk.   We then made 2 drill holes sequentially with 2.5-mm then 3.5-mm drill  holes at the base of proximal phalanx, and then with 3-0 PDS pulled the  FDL tendon through the drill hole from plantar to dorsal, completing the   FDL-to-proximal phalanx tendon transfer.  We then placed a 0.045 K-wire  antegrade through the middle and distal phalanxes, reduced the PIP  joint, and with the toe held in an anatomic position and tension placed  on the FDL tendon through the drill hole with the ankle in neutral  dorsiflexion, fired the K-wire across the MTP joint.  This maintained  the tension on the FPL tendon as well as the second toe position in a  desired reduced position.   We then obtained final stress x-rays in AP and lateral planes that  showed no gross motion across the osteotomy site.  Fixation, proposition  and shortening were excellent, as well.  Tourniquet was deflated,  hemostasis was obtained.   EDB-to-EDL tendon was then transferred using 3-0 PDS stitch.  This was  sewn to the stump of the FDL tendon dorsally, recreating the extensor  expansion.  K-wire was bent, cut and capped.  Skin-relieving incisions  were made on either side of the K-wire.  Hemostasis was obtained.  The areas were copiously irrigated with normal saline.   Local bone graft obtained throughout the entire procedure to include the  simple bunionectomy, the proximal phalanx head, resection spur removal  and bone from the drill bits were all placed on the back table and used  as local bone graft.  Bur was used to create stress strain-relieving  spot wells at the first TMP joint, and the bone graft was packed in this  area.   The Subcu was closed with 3-0 Vicryl, skin was closed with 4-0 nylon  over all wounds.  Sterile dressing was applied.  Modified Jones dressing  was applied.  The patient was stable to the PAR.   POSTOPERATIVE COURSE:  The patient is to follow up in 2 weeks.  At that  time, remove the Jones dressing, as well, and suture.  We will place her  in a short-leg nonweightbearing cast at the ankle in neutral  dorsiflexion which  she will wear for a total nonweightbearing time of 2  months.  At 6 weeks postop, however, she  will follow up.  At that time,  we will remove the cast; however we will remove the K-wire for her  second toe.  She will at that point perform plantar flexion and  stretching exercises of the second toe.  As for her wounds, once the  stitches are removed, she is perform skin mobilization over the second  toe, as well.  At the end of the 2 months, the cast will be removed, and  she will be placed in a Cam walker boot.  She will start therapy for  active-passive range of motion, exercise the ankle and talar joint with  Thera-Band strengthening, as well.  She will transition to more  proprioceptive exercises and wean herself out of the boot at the end of  3 months into a normal shoe.  She will also have a silicone  bunion pad placed at 2 weeks postop, as well, which she will wear for a  total of 6 weeks.  Elevation is encouraged. She will be on Lovenox for a  total of 2 months during her nonweightbearing stage due to her history  of DVT; this has been set up for her.      Leonides Grills, M.D.  Electronically Signed     PB/MEDQ  D:  05/09/2007  T:  05/09/2007  Job:  [id number]   cc:   Valetta Mole. Cato Mulligan, MD  Areatha Keas, M.D.

## 2011-04-23 NOTE — Telephone Encounter (Signed)
cipro 250 mg pobid for 5 days 

## 2011-04-23 NOTE — Discharge Summary (Signed)
[place]  Patient:    [name], [name]                MRN: [id number] Adm. Date:  [id number] Disc. Date: [id number] Attending:  Judie Petit CC:         Evette Georges, M.D. [facility]             Norval Gable. [place], M.D.                           Discharge Summary  DISCHARGE DIAGNOSES: 1. Deep vein thrombosis. 2. History of hormone replacement therapy.  DISCHARGE MEDICATIONS: 1. Lovenox 75 mg subcutaneously q.12h. until told to discontinue. 2. Coumadin 5 mg p.o. q.d.  LABORATORY DATA:  Discharge INR of 1.1.  CONDITION ON DISCHARGE:  Ambulating without difficulty.  She still has some right lower extremity edema.  FOLLOWUP:  Follow up with Dr. Valetta Mole. [place] in two days.  HISTORY OF PRESENT ILLNESS:  [name] is a 65 year old female whom I saw in the office late yesterday with a history of right lower extremity edema. She relates an interesting story of a three to four-week history of lower extremity erythema.  Apparently she was seen three weeks ago for a lower extremity erythema.  Thought to have cellulitis and was treated with Keflex. She was treated for 25 days with Keflex.  She then developed a bilateral lower extremity rash consisting of petechiae.  She was referred to dermatology.  At that time a biopsy was performed of the areas of petechiae.  She was told that the biopsy was normal.  Those results are not available for review.  Also apparently she had some blood work, including a CMET and a CBC, and she was told that these were normal.  Those records are also not available for review. (Dr. Norval Gable. [place] office).  Approximately three days ago she developed increasing lower extremity edema, right greater than left.  She also noted a resolution of the left lower extremity petechiae and increase of the right lower extremity petechiae.  She also noted erythema and edema of the right lower extremity.  She denied  any shortness of breath or leg trauma.  It is interesting to note that six weeks ago she drove to [place] and back.  She did not have any complaints at that time, or any edema at that time.  FAMILY HISTORY:  Interesting for her mother who died suddenly at the age of 23.  No autopsy was performed.  There is no other history of possible pulmonary emboli.  No family history of any other clotting disorder.  SOCIAL HISTORY:  She has been on hormone replacement therapy for many years (early menopause).  She works for [employer].  She is a nonsmoker.  DISCHARGE PHYSICAL EXAMINATION:  GENERAL:  She appears well.  She appears as a well-developed, well-nourished female, in no acute distress.  VITAL SIGNS:  Temperature 98.4 degrees, pulse 81, respirations 16, blood pressure 97/40.  HEENT:  Normocephalic, atraumatic.  Extraocular muscles intact.  NECK:  Supple without lymphadenopathy or thyromegaly.  CHEST:  Clear to auscultation.  CARDIAC:  A regular rate.  S1 and S2 normal, without murmurs or gallops.  EXTREMITIES:  No clubbing or cyanosis.  She has petechiae over her right lower extremity and a few petechiae on her left lower extremity.  She has 1-2+ edema to the midcalf of her right lower extremity.  There is  some erythema of her right lower extremity.  DISCHARGE MEDICATIONS:  The patient will be discharged on the above medications.  She has been taught how to give her own Lovenox.  She understands the risks of Lovenox and Coumadin.  INSTRUCTIONS:  She will go home, and elevate her leg.  FOLLOWUP:  I will see her in two days.  I will obtain the blood work from Dr. Ilda Mori office, so that we do not have to repeat that.  I will also perform a chest x-ray at my office on Monday. DD:  10/15/00 TD:  10/15/00 Job: [id number]

## 2011-04-23 NOTE — Op Note (Signed)
[name], [name]         ACCOUNT NO.:  [id number]   MEDICAL RECORD NO.:  [id number]          PATIENT TYPE:  AMB   LOCATION:  DSC                          FACILITY:  [facility]   PHYSICIAN:  Leonides Grills, M.D.     DATE OF BIRTH:  1946-08-01   DATE OF PROCEDURE:  09/21/2005  DATE OF DISCHARGE:                                 OPERATIVE REPORT   PREOPERATIVE DIAGNOSIS:  1.  Left hallux valgus.  2.  Left second and third hammer toes.  3.  Left second and third metatarsalgia.  4.  Left tight gastrocnemius.  5.  Left bunionette.  6.  Left benign deep soft tissue lesions, foot.   POSTOPERATIVE DIAGNOSIS:  1.  Left hallux valgus.  2.  Left second and third hammer toes.  3.  Left second and third metatarsalgia.  4.  Left tight gastrocnemius.  5.  Left bunionette.  6.  Left benign deep soft tissue lesions, foot.   PROCEDURE:  1.  Left Chevron bunionectomy.  2.  Stress x-rays of the left foot.  3.  Left second and third Weil metatarsal shortening osteotomies.  4.  Left second and third toes MTP joint dorsal capsulotomies with      collateral releases.  5.  Left second and third toes proximal phalanx head resections.  6.  Left second and third toes EDB to EDL tendon transfers.  7.  Left second FDL to proximal phalanx tendon transfer.  8.  Left partial excision fifth metatarsal head.  9.  Left gastrocnemius slide.  10. Left excision benign deep soft tissue lesions foot x2, excised.   ANESTHESIA:  General.   SURGEON:  Leonides Grills, M.D.   ASSISTANT:  Lianne Cure, P.A.   ESTIMATED BLOOD LOSS:  Minimal.   TOURNIQUET TIME:  Approximately 1 hour 40 minutes.   COMPLICATIONS:  None.   DISPOSITION:  Stable to the PR.   INDICATIONS FOR PROCEDURE:  This is a 65 year old female who has rheumatoid  arthritis and longstanding left foot pain that is interfering with her life.  Before she __________ anesthesia, she was consented for the above procedure.  All risks which include  infection, neurovascular injury, nonunion, malunion,  hardware rotation or hardware failure, persistent pain, worsening pain,  prolonged recovery, stiffness, arthritis, recurrence of deformity, and wound  compromise were all explained and questions were encouraged and answered.   DESCRIPTION OF PROCEDURE:  The patient was brought to the operating room and  placed in the supine position.  After adequate general endotracheal tube  anesthesia was administered as well as Ancef 1 gram IV piggyback.  The left  lower extremity was then prepped and draped in a sterile manner over a  proximally placed thigh tourniquet.  We started the procedure with a  longitudinal incision on the medial gastrocnemius muscle tendinous junction.  Dissection was carried down through the skin.  Hemostasis was obtained.  The  fascia was opened in line with the incision.  The conjoin region was then  developed between the gastrocnemius and soleus.  Soft tissue was then  elevated off the posterior aspect of the gastrocnemius.  Sural nerve was  identified and protected posteriorly throughout the case.  Gastrocnemius  tendon was then released with the curved Mayo scissors.  This had an  excellent release of tight gastrocnemius.  The area was copiously irrigated  with normal saline.  Subcutaneous was closed with 3-0 Vicryl.  Skin was  closed with 4-0 Monocryl subcuticular stitch.  Steri-Strips were applied.  The limb was then gravity exsanguinated and the tourniquet was elevated to  290 mmHg.  A longitudinal incision over the benign deep soft tissue lesion  over the lateral aspect of the forefoot was then made.  Dissection was  carried down through the skin.  Hemostasis was obtained.  Soft tissue lesion  was then encountered.  This had a cystic portion to it consistent with a  synovial cyst.  This was then dissected out carefully.  Underneath this  there was a rheumatoid nodule type looking lesion.  Again this was  dissected  out as well, both of which were sent to pathology.  The area was copiously  irrigated with normal saline.  The other lesion was on the lateral aspect of  the midfoot at the base of the fifth metatarsal.  A separate incision was  made.  Dissection was carried down through skin.  Hemostasis was obtained.  This was consistent with a rheumatoid nodule and this was carefully  dissected out in all planes and removed and sent to pathology.  Both areas  hemostasis was obtained.  The area was copiously irrigated with normal  saline.  Subcutaneous tissue was closed with 3-0 Vicryl.  The skin was  closed with 4-0 nylon.  We then extended the incision distally and laterally  to the head of the fifth metatarsal.  Dissection was carried down to the  fifth metatarsal capsule.  The capsule was opened in line with the incision.  Soft tissue was elevated around the lateral aspect of the fifth metatarsal  head with the sagittal saw.  The lateral aspect of the fifth metatarsal head  was then removed.  The edges were then rounded off with the rongeur.  The  area was copiously irrigated with normal saline.  This was palpated through  the skin and adequately decompressed.  We then made a longitudinal incision  over the medial aspect of the great toe MTP joint.  Dissection was carried  down through the skin.  Hemostasis was obtained.  Neurovascular structures  were identified both superiorly and inferiorly and protected throughout the  case.  L-shaped capsulotomy was then made.  Simple bunionectomy was then  performed with the sagittal saw.  Lateral capsule was then released with a  curved Beaver blade.  The __________ was then rounded off with the rongeur.  Center of the head was then identified.  Chevron osteotomy was then created  with a sagittal saw.  Head was then translated approximately 3-4 mm  laterally and then affixed with a 2.0 mm fully threaded cortical stainless screw using 1.5 mm drill  hole respectively.  This was countersunk.  The  redundant bone medially was then sawed off with the sagittal saw.  The area  and joint were copiously irrigated with normal saline.  Capsule was advanced  both superiorly and proximally and reconstructed with a 2-0 Vicryl suture.  This had excellent maintenance of the correction and placement of the toe in  proper position as well.  The MTP joint was then ranged and had excellent  range of motion.  We then obtained stress x-rays in the AP and lateral  planes which showed no gross motion across the osteotomy site.  Fixation was  in proper position and the correction was excellent as well.  We then made a  longitudinal incision over the second toe dorsally.  Dissection was carried  down through the skin and hemostasis was obtained.  EDB to EDL tendons were  then identified.  The EDL was then tenotomized proximal and medial and the  brevis distal lateral and retracted out of harm's way for later transfer.  A  dorsal capsulotomy with collateral release was then performed with a 15  blade scalpel with the assistant retracting the neurovascular structures  medially and laterally.  The distal aspect of the proximal phalanx was then  skeletonized and the head was then removed with a rongeur as well as a bone  cutter.  This was cut perpendicular to long axis of the shaft.  We then went  back to the MTP joint, plantar flexed the toe, and with a stacked sagittal  saw blade, performed a Weil metatarsal shortening osteotomy with the  osteotomy parallel to the plantar aspect of the foot.  The head was then  translated 4-5 mm proximally and then fixed with a 2.0 mm fully threaded  cortical lag screw measuring 12 mm in length using 2.0 and 1.5 mm holes  respectively.  This had excellent purchase and maintenance of correction.  Redundant bone shelf dorsally was then rounded off with the rongeur.  We  then went back to the PIP joint and made a longitudinal  incision in the  plantar plate of the PIP joint.  FDL tendon was then identified and  tenotomized as distal as possible.  We then made a 3.5 mm drill hole in the  base of the proximal phalanx.  With 3-0 PDS suture, we then pulled the FDL  tendon through the drill hole completing the FDL to proximal phalanx  transfer.  Once this was done, we then placed a K-wire antegrade through the  middle and distal phalanx at the tip of the toe, reduced the PIP joint, held  the toe in a proper position with tension on the FDL tendon with the ankle  in neutral dorsiflexion and fired the K-wire across the MTP joint.  This  held the toe in excellent alignment and maintenance of the correction.  We  then completed the EDB to EDL tendon transfer dorsally as well as sewing  this to the FDL tendon dorsally as well using 3-0 PDS suture.  This had an  Conservation officer, historic buildings.  Skin relieving incisions were made on either side of the K-wire as well.  The same exact procedure was performed on the third toe  through a separate incision, except that on the third toe we did not do an  FDL to proximal phalanx transfer due to the fact that the plantar plate was  intact and at rest the MTP joint did not dorsally dislocate and translate as  it did on the second toe.  Once the procedure was completed, the tourniquet  was deflated.  Hemostasis was obtained.  Wounds were closed with 4-0 nylon  suture.  Toes pinked up beautifully.  There was bleeding at the tip of the  toe as well.  Skin relieving incisions were made on either side of the K-  wire.  K-wires were bent, cut, and capped.  Final stress x-rays were  obtained in AP and lateral planes and showed excellent correction, no gross  motion across the osteotomy sites, and fixation  of proper position as well.  Sterile dressing was applied, modified Jones dressing was applied.  The  patient was stable to the PR.      Leonides Grills, M.D.  Electronically Signed     PB/MEDQ   D:  09/21/2005  T:  09/21/2005  Job:  [id number]

## 2011-04-27 ENCOUNTER — Telehealth: Payer: Self-pay | Admitting: *Deleted

## 2011-04-27 NOTE — Telephone Encounter (Signed)
Why is she taking it?  

## 2011-04-27 NOTE — Telephone Encounter (Signed)
Pt got leg cramps from the Cipro and quit taking on Saturday.  Needs another antibotic called in .  [facility] [place]

## 2011-04-28 MED ORDER — SULFAMETHOXAZOLE-TRIMETHOPRIM 800-160 MG PO TABS: 1.0000 | ORAL_TABLET | Freq: Two times a day (BID) | ORAL | Status: AC

## 2011-04-28 NOTE — Telephone Encounter (Signed)
The nurse at her job did a u/a and per pt Dr Cato Mulligan called in cipro.

## 2011-04-28 NOTE — Telephone Encounter (Signed)
LMTCB

## 2011-04-28 NOTE — Telephone Encounter (Signed)
Left message on pt's voice-mail.

## 2011-04-28 NOTE — Telephone Encounter (Signed)
Septra ds bid for 3 days

## 2011-06-07 ENCOUNTER — Telehealth: Payer: Self-pay | Admitting: Internal Medicine

## 2011-06-07 NOTE — Telephone Encounter (Signed)
Schedule OV---the rash could be related to other issues

## 2011-06-07 NOTE — Telephone Encounter (Signed)
Pt called 7/2. Had poison ivy x1 month ago. Seemed to be clearing up, but it is now resurfacing. Pt would like a call back to determine best course of action.

## 2011-06-08 NOTE — Telephone Encounter (Signed)
Pt sch ov and then call back to cx. Pt says that she would rsc later.

## 2011-06-14 ENCOUNTER — Ambulatory Visit: Payer: BC Managed Care – PPO | Admitting: Internal Medicine

## 2011-06-19 ENCOUNTER — Ambulatory Visit (INDEPENDENT_AMBULATORY_CARE_PROVIDER_SITE_OTHER): Payer: BC Managed Care – PPO | Admitting: Internal Medicine

## 2011-06-19 ENCOUNTER — Encounter: Payer: Self-pay | Admitting: Internal Medicine

## 2011-06-19 VITALS — BP 124/60 | HR 88 | Temp 97.9°F | Wt 159.8 lb

## 2011-06-19 DIAGNOSIS — R05 Cough: Secondary | ICD-10-CM

## 2011-06-19 DIAGNOSIS — J988 Other specified respiratory disorders: Secondary | ICD-10-CM | POA: Insufficient documentation

## 2011-06-19 DIAGNOSIS — R059 Cough, unspecified: Secondary | ICD-10-CM | POA: Insufficient documentation

## 2011-06-19 MED ORDER — HYDROCODONE-HOMATROPINE 5-1.5 MG/5ML PO SYRP: 5.0000 mL | ORAL_SOLUTION | ORAL | Status: DC | PRN

## 2011-06-19 NOTE — Patient Instructions (Signed)
This acts like a viral respiratory infection.  A chest cold  That will run its course and could get worse before it gets better. However   If you get significant   Fever .  Shortness of breath or not turning the corner  in another 5-7 days then call. Most coughs will last  2-3 weeks all together. Avoid respiratory  irritants.

## 2011-06-19 NOTE — Progress Notes (Signed)
  Subjective:    Patient ID: [name], female    DOB: 1946-02-18, 65 y.o.   MRN: [id number]  HPI Comes in to Saturday clinic to day for cough of 3 days  Of  .  No fever no URi sx .  No ets.    No one sick . No hx of asthma lung disease and pneumonia .  Used nyquil and slept ok. Sprayed  Jul 3 and July 8th. Had  Shot of cortisone   Last ov 10 2011 for check up with PCP  Review of Systems Derm gave her shot of cortisone  For derm  Rash  ? Contact vs bug bites.   No cp sob new rash wheeze slight nose congestion .  No fever ? If cold today .    Past history family history social history reviewed in the electronic medical record.     Objective:   Physical Exam Physical Exam: Vital signs reviewed unable to register the pulse ox BJY:NWGN is a well-developed well-nourished alert cooperative  white female who appears her stated age in no acute distress.  HEENT: normocephalic  traumatic , Eyes: PERRL EOM's full, conjunctiva clear, Nares: paten,t no deformity congestion  Slight  Blood   Left nares  or tenderness., Ears: no deformity EAC's clear TMs with normal landmarks. Mouth: clear OP, no lesions, edema.  Moist mucous membranes. Dentition in adequate repair. NECK: supple without masses, thyromegaly or bruits. CHEST/PULM:  Clear to auscultation and percussion breath sounds equal no wheeze , rales or rhonchi. No chest wall deformities or tenderness. CV: PMI is nondisplaced, S1 S2 no gallops, murmurs, rubs. Peripheral pulses are full without delay.No JVD .  ABDOMEN: Bowel sounds normal nontender  No guard or rebound, no hepato splenomegal no CVA tenderness.  No hernia. Extremtities:  No clubbing cyanosis or edema, no acute joint swelling or redness no focal atrophy NEURO:  Oriented x3, cranial nerves 3-12 appear to be intact, no obvious focal weakness, SKIN: No acute rashes normal turgor, color, no bruising or petechiae. PSYCH: Oriented, good eye contact, no obvious depression anxiety,  cognition and judgment appear normal.  Pulse ox unable to obtain  Reading in clinic.      Assessment & Plan:  Cough   X 4 days  Nl exam except mild ur congestion ? Viral rti   Did have exposrue to  Flea spray but had been on systemic steroid  at that time.    Expectant management. And close observation for  Alarm features

## 2011-06-21 ENCOUNTER — Telehealth: Payer: Self-pay | Admitting: *Deleted

## 2011-06-21 NOTE — Telephone Encounter (Signed)
[facility] Triage Call Report Triage Record Num: [id number] Operator: Tomasita Crumble Patient Name: [name] Call Date & Time: 06/19/2011 8:02:35AM Patient Phone: [phone] PCP: Valetta Mole. [place] Patient Gender: Female PCP Fax : [phone] Patient DOB: 10/31/1946 Practice Name: [facility] Reason for Call: Pt. calling. Reports cough, congestion onset 7/11. Worse on 7/13. Afebrile/tactile. Caller reports she swallows sputum. Burning in chest with cough. Emergent sx ruled out. Advised see in 24 hours per Cough protocol. Recommended caller contact office at 0900 for possible appt. at [facility] office 7/14. Caller agreed. Home care measures for the interim. Protocol(s) Used: Cough - Adult Recommended Outcome per Protocol: See Provider within 24 hours Reason for Outcome: Sharp, fleeting chest pain only occurring with deep breath or cough AND not responsive to home care Care Advice: ~ Use a cool mist humidifier to moisten air. Be sure to clean according to manufacturer's instructions. ~ Limit activities and increase periods of rest. Limit or avoid exposure to irritants and allergens (e.g. air pollution, smoke/smoking, chemicals, dust, pollen, pet dander, etc.) ~ Call EMS 911 if sudden worsening of breathing problems, dusky or blue color to skin, continuous chest pain for 5 minutes or more, weakness, or confusion occurs. ~ Analgesic/Antipyretic Advice - Acetaminophen: Consider acetaminophen as directed on label or by pharmacist/provider for pain or fever PRECAUTIONS: - Use if there is no history of liver disease, alcoholism, or intake of three or more alcohol drinks per day - Only if approved by provider during pregnancy or when breastfeeding - During pregnancy, acetaminophen should not be taken more than 3 consecutive days without telling provider - Do not exceed recommended dose or frequency ~ 06/19/2011 8:12:35AM Page 1 of 1 [id number]

## 2011-06-24 ENCOUNTER — Ambulatory Visit (INDEPENDENT_AMBULATORY_CARE_PROVIDER_SITE_OTHER): Payer: BC Managed Care – PPO | Admitting: Internal Medicine

## 2011-06-24 ENCOUNTER — Encounter: Payer: Self-pay | Admitting: Internal Medicine

## 2011-06-24 DIAGNOSIS — J988 Other specified respiratory disorders: Secondary | ICD-10-CM

## 2011-06-24 DIAGNOSIS — R05 Cough: Secondary | ICD-10-CM

## 2011-06-24 MED ORDER — HYDROCODONE-HOMATROPINE 5-1.5 MG/5ML PO SYRP: 5.0000 mL | ORAL_SOLUTION | ORAL | Status: AC | PRN

## 2011-06-24 NOTE — Patient Instructions (Signed)
Chest x-ray as discussed  Get plenty of rest, Drink lots of  clear liquids, and use Tylenol  for fever and discomfort.    Celebrex one capsule twice daily  Continue cough medications

## 2011-06-24 NOTE — Progress Notes (Signed)
  Subjective:    Patient ID: [name], female    DOB: 1946-10-20, 65 y.o.   MRN: [id number]  HPI  65 year old patient who is seen today with a one-week history of sore throat chest congestion and cough. Cough is her predominant symptom. It is nonproductive. She has no fever. She was seen at the Saturday clinic and treated symptomatically with cough medicine. 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 She does have a Cipro allergy    Review of Systems  Constitutional: Negative.   HENT: Positive for congestion. Negative for hearing loss, sore throat, rhinorrhea, dental problem, sinus pressure and tinnitus.   Eyes: Negative for pain, discharge and visual disturbance.  Respiratory: Positive for cough. Negative for shortness of breath.   Cardiovascular: Negative for chest pain, palpitations and leg swelling.  Gastrointestinal: Negative for nausea, vomiting, abdominal pain, diarrhea, constipation, blood in stool and abdominal distention.  Genitourinary: Negative for dysuria, urgency, frequency, hematuria, flank pain, vaginal bleeding, vaginal discharge, difficulty urinating, vaginal pain and pelvic pain.  Musculoskeletal: Negative for joint swelling, arthralgias and gait problem.  Skin: Negative for rash.  Neurological: Negative for dizziness, syncope, speech difficulty, weakness, numbness and headaches.  Hematological: Negative for adenopathy.  Psychiatric/Behavioral: Negative for behavioral problems, dysphoric mood and agitation. The patient is not nervous/anxious.        Objective:   Physical Exam  Constitutional: She is oriented to person, place, and time. She appears well-developed and well-nourished.  HENT:  Head: Normocephalic.  Right Ear: External ear normal.  Left Ear: External ear normal.  Mouth/Throat: Oropharynx is clear and moist.  Eyes:  Conjunctivae and EOM are normal. Pupils are equal, round, and reactive to light.  Neck: Normal range of motion. Neck supple. No thyromegaly present.  Cardiovascular: Normal rate, regular rhythm, normal heart sounds and intact distal pulses.   Pulmonary/Chest: Effort normal and breath sounds normal.       A few coarse rhonchi. No respiratory distress O2 saturation 98%  Abdominal: Soft. Bowel sounds are normal. She exhibits no mass. There is no tenderness.  Musculoskeletal: Normal range of motion.  Lymphadenopathy:    She has no cervical adenopathy.  Neurological: She is alert and oriented to person, place, and time.  Skin: Skin is warm and dry. No rash noted.  Psychiatric: She has a normal mood and affect. Her behavior is normal.          Assessment & Plan:   We'll continue symptomatic treatment for a suspected URI. Will check a chest x-ray. We'll call she develops any worsening or fever

## 2011-06-28 NOTE — Telephone Encounter (Signed)
Seen by Dr. Amador Cunas June 24, 2011

## 2011-07-22 ENCOUNTER — Telehealth: Payer: Self-pay | Admitting: *Deleted

## 2011-07-22 NOTE — Telephone Encounter (Addendum)
Pt would like to know if Dr. Cato Mulligan would recommend a shingles vaccine for she and husband?

## 2011-07-22 NOTE — Telephone Encounter (Signed)
Yes, over age 65

## 2011-07-23 NOTE — Telephone Encounter (Signed)
Notified pt. 

## 2011-07-28 ENCOUNTER — Telehealth: Payer: Self-pay | Admitting: Internal Medicine

## 2011-07-28 DIAGNOSIS — Z0000 Encounter for general adult medical examination without abnormal findings: Secondary | ICD-10-CM

## 2011-07-28 NOTE — Telephone Encounter (Signed)
Put in future orders  

## 2011-07-28 NOTE — Telephone Encounter (Signed)
I scheduled this pt for a cpx on 10/18. She is having her cpx labs at the [facility] on 10/11. Please put an order in for those labs, per [facility] email Truddie Hidden). Thanks.

## 2011-09-06 DIAGNOSIS — K4090 Unilateral inguinal hernia, without obstruction or gangrene, not specified as recurrent: Secondary | ICD-10-CM

## 2011-09-06 HISTORY — DX: Unilateral inguinal hernia, without obstruction or gangrene, not specified as recurrent: K40.90

## 2011-09-16 ENCOUNTER — Other Ambulatory Visit (INDEPENDENT_AMBULATORY_CARE_PROVIDER_SITE_OTHER): Payer: BC Managed Care – PPO

## 2011-09-16 DIAGNOSIS — Z0000 Encounter for general adult medical examination without abnormal findings: Secondary | ICD-10-CM

## 2011-09-16 LAB — CBC WITH DIFFERENTIAL/PLATELET
Basophils Relative: 0.7 % (ref 0.0–3.0)
Eosinophils Relative: 3 % (ref 0.0–5.0)
HCT: 35.9 % — ABNORMAL LOW (ref 36.0–46.0)
Lymphs Abs: 1.1 10*3/uL (ref 0.7–4.0)
MCV: 95.3 fl (ref 78.0–100.0)
Monocytes Absolute: 0.5 10*3/uL (ref 0.1–1.0)
RBC: 3.77 Mil/uL — ABNORMAL LOW (ref 3.87–5.11)
WBC: 3.6 10*3/uL — ABNORMAL LOW (ref 4.5–10.5)

## 2011-09-16 LAB — LIPID PANEL
Cholesterol: 180 mg/dL (ref 0–200)
VLDL: 8.6 mg/dL (ref 0.0–40.0)

## 2011-09-16 LAB — HEPATIC FUNCTION PANEL
AST: 20 U/L (ref 0–37)
Albumin: 4.2 g/dL (ref 3.5–5.2)

## 2011-09-16 LAB — BASIC METABOLIC PANEL
BUN: 17 mg/dL (ref 6–23)
Calcium: 9.2 mg/dL (ref 8.4–10.5)
GFR: 89.03 mL/min (ref >60.00)
Glucose, Bld: 81 mg/dL (ref 70–99)
Potassium: 3.9 mEq/L (ref 3.5–5.1)

## 2011-09-23 ENCOUNTER — Ambulatory Visit (INDEPENDENT_AMBULATORY_CARE_PROVIDER_SITE_OTHER): Payer: BC Managed Care – PPO | Admitting: Internal Medicine

## 2011-09-23 ENCOUNTER — Encounter: Payer: Self-pay | Admitting: Internal Medicine

## 2011-09-23 VITALS — BP 124/60 | HR 76 | Temp 98.1°F | Ht 67.5 in | Wt 153.0 lb

## 2011-09-23 DIAGNOSIS — K4090 Unilateral inguinal hernia, without obstruction or gangrene, not specified as recurrent: Secondary | ICD-10-CM | POA: Insufficient documentation

## 2011-09-23 DIAGNOSIS — Z0000 Encounter for general adult medical examination without abnormal findings: Secondary | ICD-10-CM

## 2011-09-23 DIAGNOSIS — Z23 Encounter for immunization: Secondary | ICD-10-CM

## 2011-09-23 LAB — POCT HEMOGLOBIN-HEMACUE: Hemoglobin: 10.1 — ABNORMAL LOW

## 2011-09-23 NOTE — Progress Notes (Signed)
  Subjective:    Patient ID: [name], female    DOB: 1946-07-15, 65 y.o.   MRN: [id number]  HPI  cpx  Past Medical History  Diagnosis Date  . History of DVT (deep vein thrombosis)     on hormones   . Lupus anticoagulant positive    No past surgical history on file.  reports that she has never smoked. She does not have any smokeless tobacco history on file. She reports that she drinks alcohol. She reports that she does not use illicit drugs. family history is not on file. Allergies  Allergen Reactions  . Ciprofloxacin     Leg cramps  . Hydroxychloroquine Sulfate   . WUJ:WJXBJYNWGNF+AOZHYQMVH+QIONGEXBMW Acid+Aspartame     REACTION: hives     Review of Systems  patient denies chest pain, shortness of breath, orthopnea. Denies lower extremity edema, abdominal pain, change in appetite, change in bowel movements. Patient denies rashes, musculoskeletal complaints. No other specific complaints in a complete review of systems.      Objective:   Physical Exam  Well-developed well-nourished female in no acute distress. HEENT exam atraumatic, normocephalic, extraocular muscles are intact. Neck is supple. No jugular venous distention no thyromegaly. Chest clear to auscultation without increased work of breathing. Cardiac exam S1 and S2 are regular. Abdominal exam active bowel sounds, soft, nontender. Extremities no edema. Neurologic exam she is alert without any motor sensory deficits. Gait is normal. Left inguinal hernia     Assessment & Plan:  Well visit. Health maint UTD

## 2011-09-23 NOTE — Assessment & Plan Note (Signed)
She would like to have it repaired- i'll re-refer to Dr. Margaree Mackintosh

## 2011-09-23 NOTE — Progress Notes (Signed)
Addended by: Alfred Levins D on: 09/23/2011 11:27 AM   Modules accepted: Orders

## 2011-09-24 ENCOUNTER — Encounter (INDEPENDENT_AMBULATORY_CARE_PROVIDER_SITE_OTHER): Payer: Self-pay | Admitting: Surgery

## 2011-10-05 ENCOUNTER — Encounter (INDEPENDENT_AMBULATORY_CARE_PROVIDER_SITE_OTHER): Payer: Self-pay | Admitting: Surgery

## 2011-10-06 ENCOUNTER — Other Ambulatory Visit (INDEPENDENT_AMBULATORY_CARE_PROVIDER_SITE_OTHER): Payer: Self-pay | Admitting: Surgery

## 2011-10-06 ENCOUNTER — Encounter (INDEPENDENT_AMBULATORY_CARE_PROVIDER_SITE_OTHER): Payer: Self-pay | Admitting: Surgery

## 2011-10-06 ENCOUNTER — Ambulatory Visit (INDEPENDENT_AMBULATORY_CARE_PROVIDER_SITE_OTHER): Payer: BC Managed Care – PPO | Admitting: Surgery

## 2011-10-06 VITALS — BP 128/86 | HR 60 | Temp 97.0°F | Resp 16 | Ht 67.5 in | Wt 153.1 lb

## 2011-10-06 DIAGNOSIS — K4090 Unilateral inguinal hernia, without obstruction or gangrene, not specified as recurrent: Secondary | ICD-10-CM

## 2011-10-06 NOTE — Patient Instructions (Signed)
We will schedule your surgery today. 

## 2011-10-06 NOTE — Progress Notes (Signed)
Chief Complaint  Patient presents with  . New Evaluation    eval of LIH     HPI [name] is a 65 y.o. female.  This is a 65 year old female who was seen over 5 years ago for evaluation of a left inguinal hernia. At that time she was being treated for an autoimmune disorder and was on methotrexate as well as prednisone. 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 Her left inguinal hernia has become noticeably larger and causes more discomfort. She denies any GI objective symptoms. She presents now for surgical evaluation for repair.HPI  Past Medical History  Diagnosis Date  . History of DVT (deep vein thrombosis)     on hormones   . Lupus anticoagulant positive   . Arthritis   . Left inguinal hernia October 2012  . Anemia     Past Surgical History  Procedure Date  . Foot surgery 09/21/2005, 2009, 2011    History reviewed. No pertinent family history.  Social History History  Substance Use Topics  . Smoking status: Never Smoker   . Smokeless tobacco: Never Used  . Alcohol Use: 0.0 oz/week    3-4 Glasses of wine per week     4 glasses of wine weekly    Allergies  Allergen Reactions  . Ciprofloxacin     Leg cramps  . Hydroxychloroquine Sulfate   . ZOX:WRUEAVWUJWJ+XBJYNWGNF+AOZHYQMVHQ Acid+Aspartame     REACTION: hives    Current Outpatient Prescriptions  Medication Sig Dispense Refill  . BABY ASPIRIN PO Take 1 tablet by mouth daily.        . calcium carbonate (OS-CAL) 600 MG TABS Take 600 mg by mouth 2 (two) times daily with a meal.        . Multiple Vitamin (MULTIVITAMIN) tablet Take 1 tablet by mouth daily.          Review of Systems Review of Systems ROS essentially negative Blood pressure 128/86, pulse 60, temperature 97 F (36.1 C), temperature source Temporal, resp. rate 16, height 5' 7.5" (1.715 m), weight 153 lb 2 oz (69.457 kg).  Physical Exam Physical Exam WDWN in NAD HEENT:   EOMI, sclera anicteric Neck:  No masses, no thyromegaly Lungs:  CTA bilaterally; normal respiratory effort CV:  Regular rate and rhythm; no murmurs Abd:  +bowel sounds, soft, non-tender, no masses GU:  No sign of right inguinal hernia; large left inguinal bulge - reducible when supine Ext:  Well-perfused; no edema Skin:  Warm, dry; no sign of jaundice  Data Reviewed None   Assessment    Large left inguinal hernia - reducible    Plan    Left inguinal hernia repair with mesh. The surgical procedure has been discussed with the patient.  Potential risks, benefits, alternative treatments, and expected outcomes have been explained.  All of the patient's questions at this time have been answered.  The likelihood of reaching the patient's treatment goal is good.  The patient understand the proposed surgical procedure and wishes to proceed.        [name] K. 10/06/2011, 10:30 AM

## 2011-10-14 ENCOUNTER — Telehealth: Payer: Self-pay

## 2011-10-14 NOTE — Telephone Encounter (Signed)
Pt called and stated she wanted to know if we had any shingles vaccinations available.  Called pt to make aware that we do have shingles vaccine available and pt can call back to schedule an appt.

## 2011-10-18 ENCOUNTER — Ambulatory Visit (INDEPENDENT_AMBULATORY_CARE_PROVIDER_SITE_OTHER): Payer: BC Managed Care – PPO | Admitting: *Deleted

## 2011-10-18 DIAGNOSIS — Z23 Encounter for immunization: Secondary | ICD-10-CM

## 2011-10-22 ENCOUNTER — Encounter (HOSPITAL_COMMUNITY): Payer: Self-pay | Admitting: Pharmacy Technician

## 2011-10-26 ENCOUNTER — Encounter (HOSPITAL_COMMUNITY)
Admission: RE | Admit: 2011-10-26 | Discharge: 2011-10-26 | Disposition: A | Discharge status: Home / Self Care | Payer: BC Managed Care – PPO | Source: Ambulatory Visit | Attending: Surgery | Admitting: Surgery

## 2011-10-26 ENCOUNTER — Encounter (HOSPITAL_COMMUNITY): Payer: Self-pay

## 2011-10-26 LAB — BASIC METABOLIC PANEL
BUN: 13 mg/dL (ref 6–23)
Calcium: 10.1 mg/dL (ref 8.4–10.5)
GFR calc Af Amer: >90 mL/min (ref >90)
GFR calc non Af Amer: 89 mL/min — ABNORMAL LOW (ref >90)
Potassium: 4.2 mEq/L (ref 3.5–5.1)
Sodium: 142 mEq/L (ref 135–145)

## 2011-10-26 LAB — SURGICAL PCR SCREEN
MRSA, PCR: NEGATIVE
Staphylococcus aureus: NEGATIVE

## 2011-10-26 LAB — CBC
HCT: 36 % (ref 36.0–46.0)
MCH: 30.8 pg (ref 26.0–34.0)
MCHC: 32.8 g/dL (ref 30.0–36.0)
RDW: 13.4 % (ref 11.5–15.5)

## 2011-10-26 NOTE — Pre-Procedure Instructions (Signed)
20 WILLADEAN GUYTON  10/26/2011   Your procedure is scheduled on: 11/05/11  Report to [facility] at 530 AM.  Call this number if you have problems the morning of surgery: [phone]   Remember:   Do not eat food:After Midnight.  Only  drink clear liquids: 4 Hours before arrival.  Take these medicines the morning of surgery with A SIP OF WATER: none   Do not wear jewelry, make-up or nail polish.  Do not wear lotions, powders, or perfumes. You may wear deodorant.  Do not shave 48 hours prior to surgery.  Do not bring valuables to the hospital.  Contacts, dentures or bridgework may not be worn into surgery.  Leave suitcase in the car. After surgery it may be brought to your room.  For patients admitted to the hospital, checkout time is 11:00 AM the day of discharge.   Patients discharged the day of surgery will not be allowed to drive home.  Name and phone number of your driver: ed spouse [phone]  Special Instructions: CHG Shower Use Special Wash: 1/2 bottle night before surgery and 1/2 bottle morning of surgery.   Please read over the following fact sheets that you were given: Pain Booklet, Coughing and Deep Breathing, MRSA Information and Surgical Site Infection Prevention

## 2011-10-27 ENCOUNTER — Other Ambulatory Visit (INDEPENDENT_AMBULATORY_CARE_PROVIDER_SITE_OTHER): Payer: Self-pay | Admitting: Surgery

## 2011-11-05 ENCOUNTER — Encounter (HOSPITAL_COMMUNITY): Payer: Self-pay | Admitting: Anesthesiology

## 2011-11-05 ENCOUNTER — Ambulatory Visit (HOSPITAL_COMMUNITY)
Admission: RE | Admit: 2011-11-05 | Discharge: 2011-11-05 | Disposition: A | Discharge status: Home / Self Care | Payer: BC Managed Care – PPO | Source: Ambulatory Visit | Attending: Surgery | Admitting: Surgery

## 2011-11-05 ENCOUNTER — Encounter (HOSPITAL_COMMUNITY)
Admission: RE | Disposition: A | Discharge status: Home / Self Care | Payer: Self-pay | Source: Ambulatory Visit | Attending: Surgery

## 2011-11-05 ENCOUNTER — Ambulatory Visit (HOSPITAL_COMMUNITY): Payer: BC Managed Care – PPO | Admitting: Anesthesiology

## 2011-11-05 DIAGNOSIS — Z01812 Encounter for preprocedural laboratory examination: Secondary | ICD-10-CM | POA: Insufficient documentation

## 2011-11-05 DIAGNOSIS — K4090 Unilateral inguinal hernia, without obstruction or gangrene, not specified as recurrent: Secondary | ICD-10-CM

## 2011-11-05 DIAGNOSIS — I10 Essential (primary) hypertension: Secondary | ICD-10-CM | POA: Insufficient documentation

## 2011-11-05 HISTORY — PX: INGUINAL HERNIA REPAIR: SHX194

## 2011-11-05 SURGERY — REPAIR, HERNIA, INGUINAL, ADULT
Anesthesia: General | Site: Groin | Laterality: Left | Wound class: Clean

## 2011-11-05 MED ORDER — BUPIVACAINE LIPOSOME 1.3 % IJ SUSP
INTRAMUSCULAR | Status: DC | PRN
  Administered 2011-11-05: 20 mL

## 2011-11-05 MED ORDER — CEFAZOLIN SODIUM-DEXTROSE 2-3 GM-% IV SOLR
2.0000 g | INTRAVENOUS | Status: AC
  Filled 2011-11-05: qty 50

## 2011-11-05 MED ORDER — GLYCOPYRROLATE 0.2 MG/ML IJ SOLN
INTRAMUSCULAR | Status: DC | PRN
  Administered 2011-11-05: .4 mg via INTRAVENOUS

## 2011-11-05 MED ORDER — EPHEDRINE SULFATE 50 MG/ML IJ SOLN
INTRAMUSCULAR | Status: DC | PRN
  Administered 2011-11-05: 10 mg via INTRAVENOUS

## 2011-11-05 MED ORDER — NEOSTIGMINE METHYLSULFATE 1 MG/ML IJ SOLN
INTRAMUSCULAR | Status: DC | PRN
  Administered 2011-11-05: 3 mg via INTRAVENOUS

## 2011-11-05 MED ORDER — CEFAZOLIN SODIUM-DEXTROSE 2-3 GM-% IV SOLR
INTRAVENOUS | Status: AC
  Filled 2011-11-05: qty 50

## 2011-11-05 MED ORDER — ONDANSETRON HCL 4 MG/2ML IJ SOLN: 4.0000 mg | Freq: Once | INTRAMUSCULAR | Status: DC | PRN

## 2011-11-05 MED ORDER — HYDROMORPHONE HCL PF 1 MG/ML IJ SOLN
0.2500 mg | INTRAMUSCULAR | Status: DC | PRN
  Administered 2011-11-05: 0.25 mg via INTRAVENOUS

## 2011-11-05 MED ORDER — OXYCODONE-ACETAMINOPHEN 5-325 MG PO TABS: 1.0000 | ORAL_TABLET | ORAL | Status: DC | PRN

## 2011-11-05 MED ORDER — ROCURONIUM BROMIDE 100 MG/10ML IV SOLN
INTRAVENOUS | Status: DC | PRN
  Administered 2011-11-05: 30 mg via INTRAVENOUS

## 2011-11-05 MED ORDER — OXYCODONE-ACETAMINOPHEN 5-325 MG PO TABS: 1.0000 | ORAL_TABLET | ORAL | Status: AC | PRN

## 2011-11-05 MED ORDER — PROPOFOL 10 MG/ML IV EMUL
INTRAVENOUS | Status: DC | PRN
  Administered 2011-11-05: 200 mg via INTRAVENOUS

## 2011-11-05 MED ORDER — CEFAZOLIN SODIUM 1-5 GM-% IV SOLN
INTRAVENOUS | Status: DC | PRN
  Administered 2011-11-05: 2 g via INTRAVENOUS

## 2011-11-05 MED ORDER — ONDANSETRON HCL 4 MG/2ML IJ SOLN: 4.0000 mg | INTRAMUSCULAR | Status: DC | PRN

## 2011-11-05 MED ORDER — SODIUM CHLORIDE 0.9 % IR SOLN
Status: DC | PRN
  Administered 2011-11-05: 1000 mL

## 2011-11-05 MED ORDER — MORPHINE SULFATE 2 MG/ML IJ SOLN: 2.0000 mg | INTRAMUSCULAR | Status: DC | PRN

## 2011-11-05 MED ORDER — LACTATED RINGERS IV SOLN
INTRAVENOUS | Status: DC | PRN
  Administered 2011-11-05: 07:00:00 via INTRAVENOUS

## 2011-11-05 MED ORDER — ONDANSETRON HCL 4 MG/2ML IJ SOLN
INTRAMUSCULAR | Status: DC | PRN
  Administered 2011-11-05: 4 mg via INTRAVENOUS

## 2011-11-05 MED ORDER — MIDAZOLAM HCL 5 MG/5ML IJ SOLN
INTRAMUSCULAR | Status: DC | PRN
  Administered 2011-11-05: 1 mg via INTRAVENOUS

## 2011-11-05 MED ORDER — FENTANYL CITRATE 0.05 MG/ML IJ SOLN
INTRAMUSCULAR | Status: DC | PRN
  Administered 2011-11-05: 50 ug via INTRAVENOUS

## 2011-11-05 MED ORDER — BUPIVACAINE LIPOSOME 1.3 % IJ SUSP
20.0000 mL | Freq: Once | INTRAMUSCULAR | Status: DC
Start: 2011-11-05 — End: 2011-11-05
  Filled 2011-11-05: qty 20

## 2011-11-05 SURGICAL SUPPLY — 53 items
BENZOIN TINCTURE PRP APPL 2/3 (GAUZE/BANDAGES/DRESSINGS) ×2 IMPLANT
BLADE SURG 15 STRL LF DISP TIS (BLADE) ×1 IMPLANT
BLADE SURG 15 STRL SS (BLADE) ×1
BLADE SURG ROTATE 9660 (MISCELLANEOUS) ×2 IMPLANT
CHLORAPREP W/TINT 26ML (MISCELLANEOUS) ×2 IMPLANT
CLOTH BEACON ORANGE TIMEOUT ST (SAFETY) ×2 IMPLANT
COVER SURGICAL LIGHT HANDLE (MISCELLANEOUS) ×2 IMPLANT
DECANTER SPIKE VIAL GLASS SM (MISCELLANEOUS) ×2 IMPLANT
DERMABOND ADVANCED (GAUZE/BANDAGES/DRESSINGS)
DERMABOND ADVANCED .7 DNX12 (GAUZE/BANDAGES/DRESSINGS) IMPLANT
DRAIN PENROSE 1/2X12 LTX STRL (WOUND CARE) IMPLANT
DRAPE LAPAROSCOPIC ABDOMINAL (DRAPES) IMPLANT
DRAPE LAPAROTOMY TRNSV 102X78 (DRAPE) ×2 IMPLANT
DRAPE UTILITY 15X26 W/TAPE STR (DRAPE) ×4 IMPLANT
DRSG TEGADERM 4X4.75 (GAUZE/BANDAGES/DRESSINGS) ×2 IMPLANT
ELECT CAUTERY BLADE 6.4 (BLADE) ×2 IMPLANT
ELECT REM PT RETURN 9FT ADLT (ELECTROSURGICAL) ×2
ELECTRODE REM PT RTRN 9FT ADLT (ELECTROSURGICAL) ×1 IMPLANT
GAUZE SPONGE 4X4 16PLY XRAY LF (GAUZE/BANDAGES/DRESSINGS) ×2 IMPLANT
GLOVE BIO SURGEON STRL SZ7 (GLOVE) ×2 IMPLANT
GLOVE BIOGEL PI IND STRL 7.0 (GLOVE) ×1 IMPLANT
GLOVE BIOGEL PI IND STRL 7.5 (GLOVE) ×2 IMPLANT
GLOVE BIOGEL PI INDICATOR 7.0 (GLOVE) ×1
GLOVE BIOGEL PI INDICATOR 7.5 (GLOVE) ×2
GLOVE ECLIPSE 7.5 STRL STRAW (GLOVE) ×2 IMPLANT
GLOVE SURG SS PI 6.5 STRL IVOR (GLOVE) ×2 IMPLANT
GOWN STRL NON-REIN LRG LVL3 (GOWN DISPOSABLE) ×6 IMPLANT
KIT BASIN OR (CUSTOM PROCEDURE TRAY) ×2 IMPLANT
KIT ROOM TURNOVER OR (KITS) ×2 IMPLANT
MESH ULTRAPRO 3X6 7.6X15CM (Mesh General) ×2 IMPLANT
NEEDLE HYPO 25GX1X1/2 BEV (NEEDLE) ×2 IMPLANT
NS IRRIG 1000ML POUR BTL (IV SOLUTION) ×2 IMPLANT
PACK SURGICAL SETUP 50X90 (CUSTOM PROCEDURE TRAY) ×2 IMPLANT
PAD ARMBOARD 7.5X6 YLW CONV (MISCELLANEOUS) ×2 IMPLANT
PENCIL BUTTON HOLSTER BLD 10FT (ELECTRODE) ×2 IMPLANT
SPECIMEN JAR SMALL (MISCELLANEOUS) IMPLANT
SPONGE GAUZE 4X4 12PLY (GAUZE/BANDAGES/DRESSINGS) ×2 IMPLANT
SPONGE INTESTINAL PEANUT (DISPOSABLE) ×2 IMPLANT
STRIP CLOSURE SKIN 1/2X4 (GAUZE/BANDAGES/DRESSINGS) ×2 IMPLANT
SUT MNCRL AB 4-0 PS2 18 (SUTURE) ×2 IMPLANT
SUT PDS AB 0 CT 36 (SUTURE) IMPLANT
SUT PROLENE 2 0 SH DA (SUTURE) ×2 IMPLANT
SUT SILK 2 0 SH (SUTURE) IMPLANT
SUT SILK 3 0 (SUTURE) ×2
SUT SILK 3-0 18XBRD TIE 12 (SUTURE) ×1 IMPLANT
SUT VIC AB 2-0 SH 27 (SUTURE) ×1
SUT VIC AB 2-0 SH 27X BRD (SUTURE) ×1 IMPLANT
SUT VIC AB 3-0 SH 27 (SUTURE) ×1
SUT VIC AB 3-0 SH 27XBRD (SUTURE) ×1 IMPLANT
SUT VICRYL 0 UR6 27IN ABS (SUTURE) ×2 IMPLANT
SYR CONTROL 10ML LL (SYRINGE) ×2 IMPLANT
TOWEL OR 17X24 6PK STRL BLUE (TOWEL DISPOSABLE) ×2 IMPLANT
TOWEL OR 17X26 10 PK STRL BLUE (TOWEL DISPOSABLE) ×2 IMPLANT

## 2011-11-05 NOTE — Anesthesia Postprocedure Evaluation (Signed)
  Anesthesia Post-op Note  Patient: [name]  Procedure(s) Performed:  HERNIA REPAIR INGUINAL ADULT - LEFT INGUINAL HERNIA REPAIR WITH MESH  Patient Location: PACU  Anesthesia Type: General  Level of Consciousness: awake, alert , oriented and patient cooperative  Airway and Oxygen Therapy: Patient Spontanous Breathing and Patient connected to nasal cannula oxygen  Post-op Pain: mild  Post-op Assessment: Post-op Vital signs reviewed, Patient's Cardiovascular Status Stable, Respiratory Function Stable, Patent Airway, No signs of Nausea or vomiting and Pain level controlled  Post-op Vital Signs: stable  Complications: No apparent anesthesia complications

## 2011-11-05 NOTE — Transfer of Care (Signed)
Immediate Anesthesia Transfer of Care Note  Patient: [name]  Procedure(s) Performed:  HERNIA REPAIR INGUINAL ADULT - LEFT INGUINAL HERNIA REPAIR WITH MESH  Patient Location: PACU  Anesthesia Type: General  Level of Consciousness: awake, alert , oriented and patient cooperative  Airway & Oxygen Therapy: Patient Spontanous Breathing and Patient connected to nasal cannula oxygen  Post-op Assessment: Report given to PACU RN, Post -op Vital signs reviewed and stable and Patient moving all extremities  Post vital signs: Reviewed and stable  Complications: No apparent anesthesia complications

## 2011-11-05 NOTE — Anesthesia Preprocedure Evaluation (Addendum)
Anesthesia Evaluation  Patient identified by MRN, date of birth, ID band Patient awake    Reviewed: Allergy & Precautions, H&P , NPO status , Patient's Chart, lab work & pertinent test results  History of Anesthesia Complications (+) AWARENESS UNDER ANESTHESIANegative for: history of anesthetic complications  Airway Mallampati: I TM Distance: >3 FB Neck ROM: full    Dental  (+) Teeth Intact   Pulmonary neg pulmonary ROS,    Pulmonary exam normal       Cardiovascular Exercise Tolerance: Good hypertension, regular Normal    Neuro/Psych Negative Neurological ROS  Negative Psych ROS   GI/Hepatic negative GI ROS, Neg liver ROS,   Endo/Other  Negative Endocrine ROS  Renal/GU negative Renal ROS     Musculoskeletal negative musculoskeletal ROS (+)   Abdominal   Peds  Hematology Previous positive ANA, Lupus was questioned, definitive diagnosis of Sjogren's Dz. Per pt.    Anesthesia Other Findings   Reproductive/Obstetrics negative OB ROS                          Anesthesia Physical Anesthesia Plan  ASA: II  Anesthesia Plan: General   Post-op Pain Management:    Induction: Intravenous  Airway Management Planned: Oral ETT  Additional Equipment:   Intra-op Plan:   Post-operative Plan: Extubation in OR  Informed Consent: I have reviewed the patients History and Physical, chart, labs and discussed the procedure including the risks, benefits and alternatives for the proposed anesthesia with the patient or authorized representative who has indicated his/her understanding and acceptance.   Dental advisory given  Plan Discussed with: Anesthesiologist, CRNA and Surgeon  Anesthesia Plan Comments:        Anesthesia Quick Evaluation

## 2011-11-05 NOTE — H&P (Signed)
Progress Notes Info        Progress Notes     Chief Complaint     Patient presents with     .  New Evaluation       eval of LIH     HPI  [name] is a 65 y.o. female. This is a 65 year old female who was seen over 5 years ago for evaluation of a left inguinal hernia. At that time she was being treated for an autoimmune disorder and was on methotrexate as well as prednisone. 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 Her left inguinal hernia has become noticeably larger and causes more discomfort. She denies any GI objective symptoms. She presents now for surgical evaluation for repair.HPI     Past Medical History     Diagnosis  Date     .  History of DVT (deep vein thrombosis)        on hormones     .  Lupus anticoagulant positive      .  Arthritis      .  Left inguinal hernia  October 2012     .  Anemia      Past Surgical History     Procedure  Date     .  Foot surgery  09/21/2005, 2009, 2011     History reviewed. No pertinent family history.  Social History     History     Substance Use Topics     .  Smoking status:  Never Smoker     .  Smokeless tobacco:  Never Used     .  Alcohol Use:  0.0 oz/week       3-4 Glasses of wine per week        4 glasses of wine weekly     Allergies     Allergen  Reactions     .  Ciprofloxacin        Leg cramps     .  Hydroxychloroquine Sulfate      .  NFA:OZHYQMVHQIO+NGEXBMWUX+LKGMWNUUVO Acid+Aspartame        REACTION: hives     Current Outpatient Prescriptions     Medication  Sig  Dispense  Refill     .  BABY ASPIRIN PO  Take 1 tablet by mouth daily.       .  calcium carbonate (OS-CAL) 600 MG TABS  Take 600 mg by mouth 2 (two) times daily with a meal.       .  Multiple Vitamin (MULTIVITAMIN) tablet  Take 1 tablet by mouth daily.       Review of Systems  Review of Systems  ROS essentially negative  Blood pressure 128/86, pulse 60,  temperature 97 F (36.1 C), temperature source Temporal, resp. rate 16, height 5' 7.5" (1.715 m), weight 153 lb 2 oz (69.457 kg).  Physical Exam  Physical Exam  WDWN in NAD  HEENT: EOMI, sclera anicteric  Neck: No masses, no thyromegaly  Lungs: CTA bilaterally; normal respiratory effort  CV: Regular rate and rhythm; no murmurs  Abd: +bowel sounds, soft, non-tender, no masses  GU: No sign of right inguinal hernia; large left inguinal bulge - reducible when supine  Ext: Well-perfused; no edema  Skin: Warm, dry; no sign of jaundice  Data Reviewed  None  Assessment  Large left  inguinal hernia - reducible  Plan  Left inguinal hernia repair with mesh. The surgical procedure has been discussed with the patient. Potential risks, benefits, alternative treatments, and expected outcomes have been explained. All of the patient's questions at this time have been answered. The likelihood of reaching the patient's treatment goal is good. The patient understand the proposed surgical procedure and wishes to proceed.  [name] K.  11/05/2011 7:13 AM

## 2011-11-05 NOTE — Op Note (Signed)
Hernia, Open, Procedure Note  Indications: The patient presented with a history of a left, reducible inguinal hernia.    Pre-operative Diagnosis: left reducible  Post-operative Diagnosis: same  Surgeon: Wynona Luna.   Assistants: None  Anesthesia: General endotracheal anesthesia and General LMA anesthesia  ASA Class: 1  Procedure Details  The patient was seen again in the Holding Room. The risks, benefits, complications, treatment options, and expected outcomes were discussed with the patient. The possibilities of reaction to medication, pulmonary aspiration, perforation of viscus, bleeding, recurrent infection, the need for additional procedures, and development of a complication requiring transfusion or further operation were discussed with the patient and/or family. The likelihood of success in repairing the hernia and returning the patient to their previous functional status is good.  There was concurrence with the proposed plan, and informed consent was obtained. The site of surgery was properly noted/marked. The patient was taken to the Operating Room, identified as [name], and the procedure verified as left inguinal hernia repair. A Time Out was held and the above information confirmed.  The patient was placed in the supine position and underwent induction of anesthesia. The lower abdomen and groin was prepped with Chloraprep and draped in the standard fashion.  An oblique incision was made. Dissection was carried down through the subcutaneous tissue with cautery to the external oblique fascia.  We opened the external oblique fascia along the direction of its fibers to the external ring.   The floor of the inguinal canal was inspected and a large direct hernia defect was noted.  We reduced the large hernia sac into the preperitoneal space .    We used a 3 x 6 inch piece of Ultrapro mesh, which was cut into an oval shape.  This was secured with 2-0 Prolene, beginning at  the pubic tubercle, running this along the internal oblique fascia superiorly and the shelving edge inferiorly.    The mesh was tucked underneath the external oblique fascia laterally.  Exparel was infiltrated into the subcutaneous tissues.  The external oblique fascia was reapproximated with 2-0 Vicryl.  3-0 Vicryl was used to close the subcutaneous tissues and 4-0 Monocryl was used to close the skin in subcuticular fashion.  Benzoin and steri-strips were used to seal the incision.  A clean dressing was applied.  The patient was then extubated and brought to the recovery room in stable condition.  All sponge, instrument, and needle counts were correct prior to closure and at the conclusion of the case.   Estimated Blood Loss: Minimal                 Complications: None; patient tolerated the procedure well.         Disposition: PACU - hemodynamically stable.         Condition: stable

## 2011-11-09 ENCOUNTER — Encounter (HOSPITAL_COMMUNITY): Payer: Self-pay | Admitting: Surgery

## 2011-11-23 ENCOUNTER — Ambulatory Visit (INDEPENDENT_AMBULATORY_CARE_PROVIDER_SITE_OTHER): Payer: BC Managed Care – PPO | Admitting: Surgery

## 2011-11-23 ENCOUNTER — Encounter (INDEPENDENT_AMBULATORY_CARE_PROVIDER_SITE_OTHER): Payer: Self-pay | Admitting: Surgery

## 2011-11-23 VITALS — BP 116/76 | HR 72 | Temp 98.3°F | Resp 12 | Ht 67.5 in | Wt 149.4 lb

## 2011-11-23 DIAGNOSIS — K4090 Unilateral inguinal hernia, without obstruction or gangrene, not specified as recurrent: Secondary | ICD-10-CM

## 2011-11-23 NOTE — Progress Notes (Signed)
Filed Vitals:   11/23/11 1432  BP: 116/76  Pulse: 72  Temp: 98.3 F (36.8 C)  Resp: 12    S/p repair of left direct inguinal hernia on 11/30.  She is doing quite well.  No pain.  Minimal swelling.  Incision is healing well with no sign of infection.  No recurrent hernia.  She may increase her level of activity.  Follow-up PRN.  Wilmon Arms. Corliss Skains, MD, [facility]  11/23/2011 3:07 PM

## 2012-03-31 ENCOUNTER — Other Ambulatory Visit: Payer: Self-pay | Admitting: Internal Medicine

## 2012-03-31 DIAGNOSIS — Z1231 Encounter for screening mammogram for malignant neoplasm of breast: Secondary | ICD-10-CM

## 2012-04-18 ENCOUNTER — Ambulatory Visit
Admission: RE | Admit: 2012-04-18 | Discharge: 2012-04-18 | Disposition: A | Discharge status: Home / Self Care | Payer: BC Managed Care – PPO | Source: Ambulatory Visit | Attending: Internal Medicine | Admitting: Internal Medicine

## 2012-04-18 DIAGNOSIS — Z1231 Encounter for screening mammogram for malignant neoplasm of breast: Secondary | ICD-10-CM

## 2012-09-26 ENCOUNTER — Other Ambulatory Visit (INDEPENDENT_AMBULATORY_CARE_PROVIDER_SITE_OTHER): Payer: BC Managed Care – PPO

## 2012-09-26 ENCOUNTER — Encounter: Payer: BC Managed Care – PPO | Admitting: Internal Medicine

## 2012-09-26 DIAGNOSIS — Z0000 Encounter for general adult medical examination without abnormal findings: Secondary | ICD-10-CM

## 2012-09-26 LAB — CBC WITH DIFFERENTIAL/PLATELET
Basophils Relative: 0.6 % (ref 0.0–3.0)
Eosinophils Absolute: 0.2 10*3/uL (ref 0.0–0.7)
Lymphocytes Relative: 33.6 % (ref 12.0–46.0)
MCHC: 32.3 g/dL (ref 30.0–36.0)
Neutrophils Relative %: 49 % (ref 43.0–77.0)
RBC: 3.8 Mil/uL — ABNORMAL LOW (ref 3.87–5.11)
WBC: 3.2 10*3/uL — ABNORMAL LOW (ref 4.5–10.5)

## 2012-09-26 LAB — LIPID PANEL
Cholesterol: 193 mg/dL (ref 0–200)
HDL: 70.1 mg/dL (ref >39.00)
LDL Cholesterol: 112 mg/dL — ABNORMAL HIGH (ref 0–99)
Triglycerides: 57 mg/dL (ref 0.0–149.0)
VLDL: 11.4 mg/dL (ref 0.0–40.0)

## 2012-09-26 LAB — POCT URINALYSIS DIPSTICK
Glucose, UA: NEGATIVE
Ketones, UA: NEGATIVE
Protein, UA: NEGATIVE
Spec Grav, UA: 1.015

## 2012-09-26 LAB — HEPATIC FUNCTION PANEL
Alkaline Phosphatase: 66 U/L (ref 39–117)
Bilirubin, Direct: 0.1 mg/dL (ref 0.0–0.3)
Total Protein: 7.3 g/dL (ref 6.0–8.3)

## 2012-09-26 LAB — BASIC METABOLIC PANEL
Calcium: 9.2 mg/dL (ref 8.4–10.5)
Creatinine, Ser: 0.8 mg/dL (ref 0.4–1.2)

## 2012-10-04 ENCOUNTER — Encounter: Payer: Self-pay | Admitting: Internal Medicine

## 2012-10-04 ENCOUNTER — Ambulatory Visit (INDEPENDENT_AMBULATORY_CARE_PROVIDER_SITE_OTHER): Payer: BC Managed Care – PPO | Admitting: Internal Medicine

## 2012-10-04 VITALS — BP 138/70 | HR 72 | Temp 98.0°F | Ht 67.0 in | Wt 157.0 lb

## 2012-10-04 DIAGNOSIS — Z0000 Encounter for general adult medical examination without abnormal findings: Secondary | ICD-10-CM

## 2012-10-04 DIAGNOSIS — Z23 Encounter for immunization: Secondary | ICD-10-CM

## 2012-10-05 NOTE — Progress Notes (Signed)
Patient ID: [name], female   DOB: [birth year], 66 y.o.   MRN: [id number] cpx No complaints Past Medical History  Diagnosis Date  . History of DVT (deep vein thrombosis)     on hormones   . Lupus anticoagulant positive   . Left inguinal hernia October 2012  . Arthritis   . Anemia     hx    History   Social History  . Marital Status: Married    Spouse Name: N/A    Number of Children: N/A  . Years of Education: N/A   Occupational History  . Not on file.   Social History Main Topics  . Smoking status: Never Smoker   . Smokeless tobacco: Never Used  . Alcohol Use: 0.0 oz/week    3-4 Glasses of wine per week     4 glasses of wine weekly  . Drug Use: No  . Sexually Active: Yes    Birth Control/ Protection: Post-menopausal   Other Topics Concern  . Not on file   Social History Narrative   Non smoker  Lives in [place]    Past Surgical History  Procedure Date  . Foot surgery 09/21/2005, 2009, 2011    both feet  . Tubal ligation     81  . Inguinal hernia repair 11/05/2011    Procedure: HERNIA REPAIR INGUINAL ADULT;  Surgeon: Wilmon Arms. Corliss Skains, MD;  Location: [facility] OR;  Service: General;  Laterality: Left;  LEFT INGUINAL HERNIA REPAIR WITH MESH    Family History  Problem Relation Age of Onset  . Heart disease Father     Allergies  Allergen Reactions  . Amoxicillin-Pot Clavulanate     REACTION: hives  . Ciprofloxacin     Leg cramps  . Hydroxychloroquine Sulfate     Facial hives    Current Outpatient Prescriptions on File Prior to Visit  Medication Sig Dispense Refill  . aspirin EC 81 MG tablet Take 81 mg by mouth daily.        . calcium carbonate (OS-CAL) 600 MG TABS Take 600 mg by mouth 2 (two) times daily with a meal.        . Multiple Vitamin (MULTIVITAMIN) tablet Take 1 tablet by mouth daily.         Current Facility-Administered Medications on File Prior to Visit  Medication Dose Route Frequency Provider Last Rate Last Dose  . ceFAZolin  (ANCEF) IVPB 2 g/50 mL premix  2 g Intravenous 60 min Pre-Op Wilmon Arms. Tsuei, MD         patient denies chest pain, shortness of breath, orthopnea. Denies lower extremity edema, abdominal pain, change in appetite, change in bowel movements. Patient denies rashes, musculoskeletal complaints. No other specific complaints in a complete review of systems.   BP 138/70  Pulse 72  Temp 98 F (36.7 C) (Oral)  Ht 5\' 7"  (1.702 m)  Wt 157 lb (71.215 kg)  BMI 24.59 kg/m2  Well-developed well-nourished female in no acute distress. HEENT exam atraumatic, normocephalic, extraocular muscles are intact. Neck is supple. No jugular venous distention no thyromegaly. Chest clear to auscultation without increased work of breathing. Cardiac exam S1 and S2 are regular. Abdominal exam active bowel sounds, soft, nontender. Extremities no edema. Neurologic exam she is alert without any motor sensory deficits. Gait is normal.   Well visit- health maint UTD-

## 2013-03-19 ENCOUNTER — Ambulatory Visit (INDEPENDENT_AMBULATORY_CARE_PROVIDER_SITE_OTHER): Payer: BC Managed Care – PPO | Admitting: Internal Medicine

## 2013-03-19 ENCOUNTER — Encounter: Payer: Self-pay | Admitting: Internal Medicine

## 2013-03-19 VITALS — BP 160/80 | HR 72 | Temp 98.3°F | Wt 156.0 lb

## 2013-03-19 DIAGNOSIS — K140 Glossitis: Secondary | ICD-10-CM

## 2013-03-19 DIAGNOSIS — K121 Other forms of stomatitis: Secondary | ICD-10-CM

## 2013-03-19 DIAGNOSIS — K1370 Unspecified lesions of oral mucosa: Secondary | ICD-10-CM

## 2013-03-19 DIAGNOSIS — M3500 Sicca syndrome, unspecified: Secondary | ICD-10-CM

## 2013-03-19 MED ORDER — LIDOCAINE VISCOUS HCL 2 % MT SOLN: 1.0000 "application " | OROMUCOSAL | Status: DC | PRN

## 2013-03-19 MED ORDER — TRIAMCINOLONE ACETONIDE 0.1 % MT PSTE: PASTE | OROMUCOSAL | Status: DC

## 2013-03-19 NOTE — Patient Instructions (Addendum)
Uncertain why you're having these mouth ulcers although dry mouth can make him last a bit longer.  I don't see anything else alarming. Would stop using Orajel  Use peroxyl l wash couple times a day at least can use topical Xylocaine as needed with care  Also Kenalog and Orabase until he gets better.  If you're having recurrent mouth ulcers I would followup with Dr. Cato Mulligan and perhaps Dr. Dierdre Forth. For reassessment.  Avoid new harsh toothpastes.

## 2013-03-19 NOTE — Progress Notes (Signed)
Chief Complaint  Patient presents with  . Mouth Lesions    Ongoing for 1 week on the left side of her tongue and one under her left upper lip.    HPI: Patient comes in today for SDA for  new problem evaluation. PCP NA  having problems with painful mouth an tongue sore s . Had episode months ago andresolved but this tome onleft tongue and alos inner lip very painful? What tot do . NO fever swollen glands or systemic sx  And no change in meds   Hx of sjogrens      beekman  Dry mouth and was on cortisone for 10 years.  Never had cold sores .  Sore  In typical place .    Lower lip. Using  ora gel.  ROS: See pertinent positives and negatives per HPI.  Past Medical History  Diagnosis Date  . History of DVT (deep vein thrombosis)     on hormones   . Lupus anticoagulant positive   . Left inguinal hernia October 2012  . Arthritis   . Anemia     hx    Family History  Problem Relation Age of Onset  . Heart disease Father     History   Social History  . Marital Status: Married    Spouse Name: N/A    Number of Children: N/A  . Years of Education: N/A   Social History Main Topics  . Smoking status: Never Smoker   . Smokeless tobacco: Never Used  . Alcohol Use: 0.0 oz/week    3-4 Glasses of wine per week     Comment: 4 glasses of wine weekly  . Drug Use: No  . Sexually Active: Yes    Birth Control/ Protection: Post-menopausal   Other Topics Concern  . None   Social History Narrative   Non smoker     [place]ives in [place]          Outpatient Encounter Prescriptions as of 03/19/2013  Medication Sig Dispense Refill  . aspirin EC 81 MG tablet Take 81 mg by mouth daily.        . calcium carbonate (OS-CAL) 600 MG TABS Take 600 mg by mouth 2 (two) times daily with a meal.        . magnesium oxide (MAG-OX) 400 MG tablet Take 400 mg by mouth daily.      . Multiple Vitamin (MULTIVITAMIN) tablet Take 1 tablet by mouth daily.        . Lidocaine HCl 2 % SOLN Use as directed 1  application in the mouth or throat every 4 (four) hours as needed.  20 mL  0  . triamcinolone (KENALOG) 0.1 % paste Place on mouth ulcer bid until heals  5 g  1   Facility-Administered Encounter Medications as of 03/19/2013  Medication Dose Route Frequency Provider Last Rate Last Dose  . ceFAZolin (ANCEF) IVPB 2 g/50 mL premix  2 g Intravenous 60 min Pre-Op Wilmon Arms. Tsuei, MD        EXAM:  BP 160/80  Pulse 72  Temp(Src) 98.3 F (36.8 C) (Oral)  Wt 156 lb (70.761 kg)  BMI 24.43 kg/m2  SpO2 97%  Body mass index is 24.43 kg/(m^2).  GENERAL: vitals reviewed and listed above, alert, oriented, appears well hydrated and in no acute distress  HEENT: atraumatic, conjunctiva  clear, no obvious abnormalities on inspection of external nose and ears OP :   Large superfical ulcer left tongue about 1 cm  And  tiny ulcer lower lip mucosal surface   NECK: no obvious masses on inspection palpation no adenopathy  Skin no acute rash MS: moves all extremities without noticeable focal  abnormality  PSYCH: pleasant and cooperative, no obvious depression or anxiety Lab Results  Component Value Date   WBC 3.2* 09/26/2012   HGB 11.8* 09/26/2012   HCT 36.3 09/26/2012   MCV 95.7 09/26/2012   PLT 172.0 09/26/2012    ASSESSMENT AND PLAN:  Discussed the following assessment and plan:  Ulcer mouth  Tongue ulcer  Sjogren's syndrome Check bp readings to make sure in range  No hx of immunnosuppression of significance and no systemic sx  . -Patient advised to return or notify health care team  if symptoms worsen or persist or new concerns arise.  Patient Instructions  Uncertain why you're having these mouth ulcers although dry mouth can make him last a bit longer.  I don't see anything else alarming. Would stop using Orajel  Use peroxyl l wash couple times a day at least can use topical Xylocaine as needed with care  Also Kenalog and Orabase until he gets better.  If you're having recurrent  mouth ulcers I would followup with Dr. Cato Mulligan and perhaps Dr. Dierdre Forth. For reassessment.  Avoid new harsh toothpastes.       Neta Mends. Dianca Owensby M.D.

## 2013-03-25 DIAGNOSIS — M3500 Sicca syndrome, unspecified: Secondary | ICD-10-CM | POA: Insufficient documentation

## 2013-07-12 ENCOUNTER — Other Ambulatory Visit: Payer: Self-pay

## 2013-07-12 DIAGNOSIS — Z1231 Encounter for screening mammogram for malignant neoplasm of breast: Secondary | ICD-10-CM

## 2013-07-26 ENCOUNTER — Ambulatory Visit
Admission: RE | Admit: 2013-07-26 | Discharge: 2013-07-26 | Disposition: A | Discharge status: Home / Self Care | Payer: BC Managed Care – PPO | Source: Ambulatory Visit

## 2013-07-26 DIAGNOSIS — Z1231 Encounter for screening mammogram for malignant neoplasm of breast: Secondary | ICD-10-CM

## 2013-10-22 ENCOUNTER — Telehealth: Payer: Self-pay | Admitting: Internal Medicine

## 2013-10-22 NOTE — Telephone Encounter (Signed)
Okay with me, will be glad to see her.  Please schedule next CPE with me.  Thanks.

## 2013-10-22 NOTE — Telephone Encounter (Signed)
FYI

## 2013-10-22 NOTE — Telephone Encounter (Signed)
Patient Information:  Caller Name: Venesa  Phone: [phone]  Patient: [name]  Gender: Female  DOB: 1946/06/12  Age: 67 Years  PCP: Birdie Sons (Adults only)  Office Follow Up:  Does the office need to follow up with this patient?: No  Instructions For The Office: N/A  RN Note:  Home care advice given per Abrasions,Scratches and Moinor Cuts protocol. Advised to call back if any worsening sxs.  Symptoms  Reason For Call & Symptoms: Pt calling about scratch on inner ankle bone that bleed profusely and took some time to get it to stop. Take baby ASA.  Reviewed Health History In EMR: Yes  Reviewed Medications In EMR: Yes  Reviewed Allergies In EMR: Yes  Reviewed Surgeries / Procedures: Yes  Date of Onset of Symptoms: 10/21/2013  Guideline(s) Used:  Abrasions, Scratches, and Minor Cuts  No Protocol Available - Sick Adult  Disposition Per Guideline:   Home Care  Reason For Disposition Reached:   Patient's symptoms are safe to treat at home per nursing judgment  Advice Given:  Call Back If:  New symptoms develop  You become worse.  Patient Will Follow Care Advice:  YES

## 2013-10-22 NOTE — Telephone Encounter (Signed)
Ok per Dr Swords 

## 2013-10-22 NOTE — Telephone Encounter (Signed)
Pt would like to know if dr Para March would accept her as a new pt? Pt still works and as Acupuncturist. Pt lives in [place] and it is 20 miles closer for her to go there. Also dr swords is rarely in the office. pls advise. Is that ok w/ you dr Para March? Is that ok w/ you dr swords?

## 2013-10-23 NOTE — Telephone Encounter (Signed)
Pt will call and sch own appt.

## 2013-12-25 ENCOUNTER — Other Ambulatory Visit: Payer: Self-pay | Admitting: Family Medicine

## 2013-12-25 DIAGNOSIS — D649 Anemia, unspecified: Secondary | ICD-10-CM

## 2013-12-25 DIAGNOSIS — E785 Hyperlipidemia, unspecified: Secondary | ICD-10-CM

## 2014-01-02 ENCOUNTER — Other Ambulatory Visit (INDEPENDENT_AMBULATORY_CARE_PROVIDER_SITE_OTHER): Payer: BC Managed Care – PPO

## 2014-01-02 DIAGNOSIS — E785 Hyperlipidemia, unspecified: Secondary | ICD-10-CM

## 2014-01-02 DIAGNOSIS — D649 Anemia, unspecified: Secondary | ICD-10-CM

## 2014-01-02 LAB — BASIC METABOLIC PANEL
BUN: 15 mg/dL (ref 6–23)
CO2: 29 mEq/L (ref 19–32)
Calcium: 9.5 mg/dL (ref 8.4–10.5)
Chloride: 106 mEq/L (ref 96–112)
Creatinine, Ser: 0.9 mg/dL (ref 0.4–1.2)
GFR: 68.79 mL/min (ref >60.00)
Glucose, Bld: 83 mg/dL (ref 70–99)
POTASSIUM: 4.1 meq/L (ref 3.5–5.1)
SODIUM: 140 meq/L (ref 135–145)

## 2014-01-02 LAB — CBC WITH DIFFERENTIAL/PLATELET
BASOS PCT: 0.6 % (ref 0.0–3.0)
Basophils Absolute: 0 10*3/uL (ref 0.0–0.1)
EOS PCT: 4.2 % (ref 0.0–5.0)
Eosinophils Absolute: 0.1 10*3/uL (ref 0.0–0.7)
HEMATOCRIT: 37.3 % (ref 36.0–46.0)
HEMOGLOBIN: 11.8 g/dL — AB (ref 12.0–15.0)
LYMPHS ABS: 1 10*3/uL (ref 0.7–4.0)
Lymphocytes Relative: 30.6 % (ref 12.0–46.0)
MCHC: 31.6 g/dL (ref 30.0–36.0)
MCV: 97.7 fl (ref 78.0–100.0)
MONO ABS: 0.4 10*3/uL (ref 0.1–1.0)
MONOS PCT: 12.5 % — AB (ref 3.0–12.0)
NEUTROS ABS: 1.8 10*3/uL (ref 1.4–7.7)
Neutrophils Relative %: 52.1 % (ref 43.0–77.0)
Platelets: 177 10*3/uL (ref 150.0–400.0)
RBC: 3.82 Mil/uL — AB (ref 3.87–5.11)
RDW: 13.6 % (ref 11.5–14.6)
WBC: 3.4 10*3/uL — AB (ref 4.5–10.5)

## 2014-01-02 LAB — LIPID PANEL
CHOL/HDL RATIO: 3
CHOLESTEROL: 178 mg/dL (ref 0–200)
HDL: 67.6 mg/dL (ref >39.00)
LDL CALC: 97 mg/dL (ref 0–99)
Triglycerides: 69 mg/dL (ref 0.0–149.0)
VLDL: 13.8 mg/dL (ref 0.0–40.0)

## 2014-01-21 ENCOUNTER — Encounter: Payer: Self-pay | Admitting: Family Medicine

## 2014-01-21 ENCOUNTER — Ambulatory Visit (INDEPENDENT_AMBULATORY_CARE_PROVIDER_SITE_OTHER): Payer: BC Managed Care – PPO | Admitting: Family Medicine

## 2014-01-21 VITALS — BP 128/80 | HR 78 | Temp 98.0°F | Ht 67.0 in | Wt 157.8 lb

## 2014-01-21 DIAGNOSIS — M949 Disorder of cartilage, unspecified: Secondary | ICD-10-CM

## 2014-01-21 DIAGNOSIS — M3500 Sicca syndrome, unspecified: Secondary | ICD-10-CM

## 2014-01-21 DIAGNOSIS — M8580 Other specified disorders of bone density and structure, unspecified site: Secondary | ICD-10-CM

## 2014-01-21 DIAGNOSIS — M359 Systemic involvement of connective tissue, unspecified: Secondary | ICD-10-CM

## 2014-01-21 DIAGNOSIS — Z0000 Encounter for general adult medical examination without abnormal findings: Secondary | ICD-10-CM

## 2014-01-21 DIAGNOSIS — M899 Disorder of bone, unspecified: Secondary | ICD-10-CM

## 2014-01-21 NOTE — Progress Notes (Signed)
CPE- Routine anticipatory guidance given to patient.  See health maintenance. Flu 2014 Shingles prev done PNA prev done Tetanus 2011 Colon 2007 Breast cancer screening 07/2013 Advance directive.  Husband designated. Has a living will.   Diet and exercise d/w pt.  Walking at lunch.  Healthy diet.    H/o anemia.  Longstanding.  Labs d/w pt.    Osteopenia.  Was on actonel during prednisone treatment (for 10 years).   Due for f/u DXA.    Her son was born with a complete heart block. 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 Working dx = sjogren's, dry eyes and dry mouth.  Sees Dr. Mallie MusselBeakman.    PMH and SH reviewed  Meds, vitals, and allergies reviewed.   ROS: See HPI.  Otherwise negative.    GEN: nad, alert and oriented HEENT: mucous membranes w/o ulceration, TM wnl NECK: supple w/o LA CV: rrr. PULM: ctab, no inc wob ABD: soft, +bs EXT: no edema SKIN: no acute rash

## 2014-01-21 NOTE — Patient Instructions (Addendum)
Shirlee LimerickMarion will call about your referral for the DEXA ([facility]).   Take care.  Glad to see you.  Good luck with the farm.

## 2014-01-22 DIAGNOSIS — Z0000 Encounter for general adult medical examination without abnormal findings: Secondary | ICD-10-CM | POA: Insufficient documentation

## 2014-01-22 NOTE — Assessment & Plan Note (Signed)
Prev dx of connective tissue disease syndrome. Working dx = sjogren's, dry eyes and dry mouth. Sees Dr. Beakman. 

## 2014-01-22 NOTE — Assessment & Plan Note (Signed)
Refer for DXA.

## 2014-01-22 NOTE — Assessment & Plan Note (Signed)
Prev dx of connective tissue disease syndrome. Working dx = sjogren's, dry eyes and dry mouth. Sees Dr. Mallie MusselBeakman.

## 2014-01-22 NOTE — Assessment & Plan Note (Signed)
Routine anticipatory guidance given to patient.  See health maintenance. Flu 2014 Shingles prev done PNA prev done Tetanus 2011 Colon 2007 Breast cancer screening 07/2013 Advance directive d/w pt.  Husband designated. Has a living will.   Diet and exercise d/w pt.  Walking at lunch.  Healthy diet.   Pap/pelvic not indicated.

## 2014-02-12 ENCOUNTER — Ambulatory Visit (INDEPENDENT_AMBULATORY_CARE_PROVIDER_SITE_OTHER)
Admission: RE | Admit: 2014-02-12 | Discharge: 2014-02-12 | Disposition: A | Discharge status: Home / Self Care | Payer: BC Managed Care – PPO | Source: Ambulatory Visit | Attending: Family Medicine | Admitting: Family Medicine

## 2014-02-12 DIAGNOSIS — M899 Disorder of bone, unspecified: Secondary | ICD-10-CM

## 2014-02-12 DIAGNOSIS — M8580 Other specified disorders of bone density and structure, unspecified site: Secondary | ICD-10-CM

## 2014-02-12 DIAGNOSIS — M949 Disorder of cartilage, unspecified: Secondary | ICD-10-CM

## 2014-02-17 ENCOUNTER — Encounter: Payer: Self-pay | Admitting: Family Medicine

## 2014-03-27 ENCOUNTER — Telehealth: Payer: Self-pay

## 2014-03-27 NOTE — Telephone Encounter (Signed)
Patient notified as instructed by telephone. Patient stated that when she got the results she was told that she has scoliosis and possible bulging disc and has been concerned about that.  Patient stated that she feels better now getting this call back. Patient stated that she will call back if she decides that she needs to be seen.

## 2014-03-27 NOTE — Telephone Encounter (Signed)
Thanks

## 2014-03-27 NOTE — Telephone Encounter (Signed)
Pt said she has not heard any further instructions from Dexa. Pt said she was told that she has scoliosis and possible bulging disc; pt wants to know if she should be concerned, any further testing or treatment needed. Pt request cb.

## 2014-03-27 NOTE — Telephone Encounter (Signed)
lugene talked to her on 02/18/14.  Osteopenia on DXA. Would not use rx meds at this point. Would continue otc calcium/vitamin D. Can consider recheck DXA in about 2-3 years. Thanks. If she is having more pain, then we need to have her come back in and discuss.  Thanks.

## 2014-06-10 ENCOUNTER — Ambulatory Visit (INDEPENDENT_AMBULATORY_CARE_PROVIDER_SITE_OTHER): Payer: BC Managed Care – PPO | Admitting: Family Medicine

## 2014-06-10 ENCOUNTER — Encounter: Payer: Self-pay | Admitting: Family Medicine

## 2014-06-10 VITALS — BP 152/84 | HR 75 | Temp 98.1°F | Wt 161.5 lb

## 2014-06-10 DIAGNOSIS — R21 Rash and other nonspecific skin eruption: Secondary | ICD-10-CM

## 2014-06-10 MED ORDER — TRIAMCINOLONE ACETONIDE 0.5 % EX CREA: 1.0000 "application " | TOPICAL_CREAM | Freq: Three times a day (TID) | CUTANEOUS | Status: DC

## 2014-06-10 NOTE — Progress Notes (Signed)
Pre visit review using our clinic review tool, if applicable. No additional management support is needed unless otherwise documented below in the visit note.  Saturday afternoon- noted something itching on her L elbow.  No tick or insect seen.  Clearly no tick attached.  Extensor side of elbow was itchy and red, area expanded the last few days.  The area was warm.  No changes in the antecubital area.  No R arm sx.  No FCNAVD.  The red area didn't hurt, it just itched.  She used cortisone cream and rubbing alcohol.  She doesn't feel poorly.    Meds, vitals, and allergies reviewed.   ROS: See HPI.  Otherwise, noncontributory.  nad L arm with normal ROM at the shoulder elbow and wrist.  Entirety of the arm not ttp Distally NV intact Blanching red and puffy near the extensor side of the L elbow w/o fluctuance.

## 2014-06-10 NOTE — Patient Instructions (Signed)
Use the cream 3 times a day on the affected area and update us tomorrow. If fever or pain or shortness of breath, then notify us or the on call doc.

## 2014-06-11 ENCOUNTER — Encounter: Payer: Self-pay | Admitting: Family Medicine

## 2014-06-11 DIAGNOSIS — R21 Rash and other nonspecific skin eruption: Secondary | ICD-10-CM | POA: Insufficient documentation

## 2014-06-11 NOTE — Assessment & Plan Note (Signed)
Looks to be irritation, not infection.  Use topical TAC and f/u prn.  No need for abx.  D/w pt. She agrees.  Could have been local reaction to insect bite or plant exposure.  No sign of tick illness or bite.

## 2014-09-26 ENCOUNTER — Ambulatory Visit (INDEPENDENT_AMBULATORY_CARE_PROVIDER_SITE_OTHER): Payer: BC Managed Care – PPO

## 2014-09-26 DIAGNOSIS — Z23 Encounter for immunization: Secondary | ICD-10-CM

## 2014-10-14 ENCOUNTER — Other Ambulatory Visit: Payer: Self-pay

## 2014-10-14 DIAGNOSIS — Z1231 Encounter for screening mammogram for malignant neoplasm of breast: Secondary | ICD-10-CM

## 2014-11-06 ENCOUNTER — Ambulatory Visit
Admission: RE | Admit: 2014-11-06 | Discharge: 2014-11-06 | Disposition: A | Discharge status: Home / Self Care | Payer: BC Managed Care – PPO | Source: Ambulatory Visit

## 2014-11-06 DIAGNOSIS — Z1231 Encounter for screening mammogram for malignant neoplasm of breast: Secondary | ICD-10-CM

## 2014-11-06 LAB — HM MAMMOGRAPHY: HM MAMMO: NORMAL

## 2014-11-07 ENCOUNTER — Encounter: Payer: Self-pay | Admitting: *Deleted

## 2015-08-25 ENCOUNTER — Encounter: Payer: Self-pay | Admitting: Family Medicine

## 2015-08-25 ENCOUNTER — Other Ambulatory Visit: Payer: Self-pay | Admitting: Family Medicine

## 2015-08-25 DIAGNOSIS — D649 Anemia, unspecified: Secondary | ICD-10-CM

## 2015-08-25 DIAGNOSIS — M8580 Other specified disorders of bone density and structure, unspecified site: Secondary | ICD-10-CM

## 2015-08-25 DIAGNOSIS — Z8639 Personal history of other endocrine, nutritional and metabolic disease: Secondary | ICD-10-CM

## 2015-08-25 DIAGNOSIS — Z119 Encounter for screening for infectious and parasitic diseases, unspecified: Secondary | ICD-10-CM

## 2015-08-31 DIAGNOSIS — Z23 Encounter for immunization: Secondary | ICD-10-CM | POA: Diagnosis not present

## 2015-10-13 ENCOUNTER — Other Ambulatory Visit (INDEPENDENT_AMBULATORY_CARE_PROVIDER_SITE_OTHER): Payer: BLUE CROSS/BLUE SHIELD

## 2015-10-13 ENCOUNTER — Other Ambulatory Visit: Payer: Self-pay | Admitting: Family Medicine

## 2015-10-13 DIAGNOSIS — Z8639 Personal history of other endocrine, nutritional and metabolic disease: Secondary | ICD-10-CM | POA: Diagnosis not present

## 2015-10-13 DIAGNOSIS — Z119 Encounter for screening for infectious and parasitic diseases, unspecified: Secondary | ICD-10-CM

## 2015-10-13 DIAGNOSIS — M8580 Other specified disorders of bone density and structure, unspecified site: Secondary | ICD-10-CM

## 2015-10-13 DIAGNOSIS — D649 Anemia, unspecified: Secondary | ICD-10-CM

## 2015-10-13 LAB — LIPID PANEL
CHOL/HDL RATIO: 3
Cholesterol: 210 mg/dL — ABNORMAL HIGH (ref 0–200)
HDL: 74 mg/dL (ref >39.00)
LDL Cholesterol: 127 mg/dL — ABNORMAL HIGH (ref 0–99)
NONHDL: 136.26
TRIGLYCERIDES: 48 mg/dL (ref 0.0–149.0)
VLDL: 9.6 mg/dL (ref 0.0–40.0)

## 2015-10-13 LAB — CBC WITH DIFFERENTIAL/PLATELET
BASOS ABS: 0 10*3/uL (ref 0.0–0.1)
Basophils Relative: 1 % (ref 0.0–3.0)
EOS ABS: 0.2 10*3/uL (ref 0.0–0.7)
Eosinophils Relative: 4.4 % (ref 0.0–5.0)
HEMATOCRIT: 36.8 % (ref 36.0–46.0)
HEMOGLOBIN: 12.2 g/dL (ref 12.0–15.0)
LYMPHS PCT: 38.2 % (ref 12.0–46.0)
Lymphs Abs: 1.3 10*3/uL (ref 0.7–4.0)
MCHC: 33 g/dL (ref 30.0–36.0)
MCV: 95.5 fl (ref 78.0–100.0)
MONO ABS: 0.4 10*3/uL (ref 0.1–1.0)
Monocytes Relative: 11.8 % (ref 3.0–12.0)
Neutro Abs: 1.6 10*3/uL (ref 1.4–7.7)
Neutrophils Relative %: 44.6 % (ref 43.0–77.0)
Platelets: 192 10*3/uL (ref 150.0–400.0)
RBC: 3.86 Mil/uL — AB (ref 3.87–5.11)
RDW: 13.9 % (ref 11.5–15.5)
WBC: 3.5 10*3/uL — AB (ref 4.0–10.5)

## 2015-10-13 LAB — COMPREHENSIVE METABOLIC PANEL
ALT: 15 U/L (ref 0–35)
AST: 17 U/L (ref 0–37)
Albumin: 4.2 g/dL (ref 3.5–5.2)
Alkaline Phosphatase: 58 U/L (ref 39–117)
BILIRUBIN TOTAL: 0.5 mg/dL (ref 0.2–1.2)
BUN: 17 mg/dL (ref 6–23)
CALCIUM: 10 mg/dL (ref 8.4–10.5)
CO2: 30 meq/L (ref 19–32)
CREATININE: 0.78 mg/dL (ref 0.40–1.20)
Chloride: 105 mEq/L (ref 96–112)
GFR: 77.62 mL/min (ref >60.00)
GLUCOSE: 92 mg/dL (ref 70–99)
Potassium: 3.9 mEq/L (ref 3.5–5.1)
Sodium: 141 mEq/L (ref 135–145)
Total Protein: 7.4 g/dL (ref 6.0–8.3)

## 2015-10-13 LAB — VITAMIN D 25 HYDROXY (VIT D DEFICIENCY, FRACTURES): VITD: 67.24 ng/mL (ref 30.00–100.00)

## 2015-10-14 LAB — HEPATITIS C ANTIBODY: HCV Ab: NEGATIVE

## 2015-10-23 ENCOUNTER — Encounter: Payer: Self-pay | Admitting: Family Medicine

## 2015-10-23 ENCOUNTER — Ambulatory Visit (INDEPENDENT_AMBULATORY_CARE_PROVIDER_SITE_OTHER): Payer: BLUE CROSS/BLUE SHIELD | Admitting: Family Medicine

## 2015-10-23 VITALS — BP 132/76 | HR 71 | Temp 98.4°F | Ht 67.0 in | Wt 141.8 lb

## 2015-10-23 DIAGNOSIS — M4120 Other idiopathic scoliosis, site unspecified: Secondary | ICD-10-CM | POA: Diagnosis not present

## 2015-10-23 DIAGNOSIS — Z7189 Other specified counseling: Secondary | ICD-10-CM

## 2015-10-23 DIAGNOSIS — M3500 Sicca syndrome, unspecified: Secondary | ICD-10-CM | POA: Diagnosis not present

## 2015-10-23 DIAGNOSIS — Z1211 Encounter for screening for malignant neoplasm of colon: Secondary | ICD-10-CM

## 2015-10-23 DIAGNOSIS — Z0000 Encounter for general adult medical examination without abnormal findings: Secondary | ICD-10-CM | POA: Diagnosis not present

## 2015-10-23 DIAGNOSIS — M8580 Other specified disorders of bone density and structure, unspecified site: Secondary | ICD-10-CM | POA: Diagnosis not present

## 2015-10-23 DIAGNOSIS — D649 Anemia, unspecified: Secondary | ICD-10-CM | POA: Diagnosis not present

## 2015-10-23 DIAGNOSIS — M359 Systemic involvement of connective tissue, unspecified: Secondary | ICD-10-CM

## 2015-10-23 DIAGNOSIS — Z23 Encounter for immunization: Secondary | ICD-10-CM

## 2015-10-23 NOTE — Patient Instructions (Addendum)
Call back about scheduling bone density test in 02/2016.   Go to the lab on the way out.  We'll contact you with your lab report (stool cards) Use the back exercises and keep stretching.   Take care.  Glad to see you.

## 2015-10-23 NOTE — Progress Notes (Addendum)
Pre visit review using our clinic review tool, if applicable. No additional management support is needed unless otherwise documented below in the visit note.  I have personally reviewed the Medicare Annual Wellness questionnaire and have noted 1. The patient's medical and social history 2. Their use of alcohol, tobacco or illicit drugs 3. Their current medications and supplements 4. The patient's functional ability including ADL's, fall risks, home safety risks and hearing or visual             impairment. 5. Diet and physical activities 6. Evidence for depression or mood disorders  The patients weight, height, BMI have been recorded in the chart and visual acuity is per eye clinic.  I have made referrals, counseling and provided education to the patient based review of the above and I have provided the pt with a written personalized care plan for preventive services.  Provider list updated- see scanned forms.  Routine anticipatory guidance given to patient.  See health maintenance.  Flu 2016 Shingles 2012 PNA 2016 Tetanus 2011 Colonoscopy 2007.  Benign path.  D/w pt.  D/w patient [id number]:options for colon cancer screening, including IFOB vs. colonoscopy.  Risks and benefits of both were discussed and patient voiced understanding.  Pt elects WUJ:[id number]:IFOB.  Breast cancer screening- 2015, okay to repeat at 2 year interval.  Still doing home checks w/o abnormality noted by patient.  Advance directive- husband designated if patient were incapacitated.  Cognitive function addressed- see scanned forms- and if abnormal then additional documentation follows.   H/o anemia. Longstanding. Labs d/w pt. CBC stable, hgb improved from prev, with still mildly low WBC.  No bleeding, no bruising.    Osteopenia. Was on actonel during prednisone treatment (for 10 years). DXA due 2017.  D/w pt.   Her son was born with a complete heart block. That triggered her w/u which lead to dx of connective tissue disease  syndrome. Working dx = sjogren's, dry eyes and dry mouth. Had seen Dr. Mallie MusselBeakman but he is out of practice now.She is using sustain eye drops and mouthwash a few times a day.  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  We talked about options.  She doesn't have active/new sx and didn't want to reestablish with a new rheum doc at this point as it wouldn't likely change mgmt.  This is reasonable.   Scoliosis, longstanding.  Asking about stretching and strengthening program to do at home.  D/w pt.    PMH and SH reviewed  Meds, vitals, and allergies reviewed.   ROS: See HPI.  Otherwise negative.    GEN: nad, alert and oriented HEENT: mucous membranes mildly dry NECK: supple w/o LA CV: rrr. PULM: ctab, no inc wob ABD: soft, +bs EXT: no edema SKIN: no acute rash but she has a benign appearing SK 8mm on the L side of the upper chest, d/w pt, reassured.  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  No active erythema.  Back with scoliotic changes noted.

## 2015-10-24 DIAGNOSIS — Z7189 Other specified counseling: Secondary | ICD-10-CM | POA: Insufficient documentation

## 2015-10-24 DIAGNOSIS — D649 Anemia, unspecified: Secondary | ICD-10-CM | POA: Insufficient documentation

## 2015-10-24 NOTE — Assessment & Plan Note (Signed)
Was on actonel during prednisone treatment (for 10 years). DXA due 2017. D/w pt.  She'll call back to schedule later on.

## 2015-10-24 NOTE — Assessment & Plan Note (Addendum)
Labs d/w pt. CBC stable.  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  Continue with observation.

## 2015-10-24 NOTE — Assessment & Plan Note (Signed)
Flu 2016 Shingles 2012 PNA 2016 Tetanus 2011 Colonoscopy 2007.  Benign path.  D/w pt.  D/w patient VH:[id number]:options for colon cancer screening, including IFOB vs. colonoscopy.  Risks and benefits of both were discussed and patient voiced understanding.  Pt elects BMW:UXLKfor:IFOB.  Breast cancer screening- 2015, okay to repeat at 2 year interval.  Still doing home checks w/o abnormality noted by patient.  Advance directive- husband designated if patient were incapacitated.  Cognitive function addressed- see scanned forms- and if abnormal then additional documentation follows.

## 2015-10-24 NOTE — Assessment & Plan Note (Deleted)
Working dx = sjogren's, dry eyes and dry mouth. Had seen Dr. Mallie MusselBeakman but he is out of practice now. She is using sustain eye drops and mouthwash a few times a day. 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 We talked about options. She doesn't have active/new sx and didn't want to reestablish with a new rheum doc at this point as it wouldn't likely change mgmt. This is reasonable.  Continue with observation for now.  She agrees, update me as needed.

## 2015-10-24 NOTE — Assessment & Plan Note (Signed)
Working dx = sjogren's, dry eyes and dry mouth. Had seen Dr. Beakman but he is out of practice now. She is using sustain eye drops and mouthwash a few times a day. 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 We talked about options. She doesn't have active/new sx and didn't want to reestablish with a new rheum doc at this point as it wouldn't likely change mgmt. This is reasonable.  Continue with observation for now.  She agrees, update me as needed.   

## 2015-10-27 DIAGNOSIS — M4120 Other idiopathic scoliosis, site unspecified: Secondary | ICD-10-CM | POA: Insufficient documentation

## 2015-10-27 NOTE — Assessment & Plan Note (Signed)
Longstanding.  D/w pt about stretching and strengthening program to do at home, handout given with routine lower back exercises and stretches.  This should be helpful.  She agrees.  Update me as needed.

## 2015-12-10 ENCOUNTER — Encounter: Payer: Self-pay | Admitting: *Deleted

## 2015-12-10 ENCOUNTER — Other Ambulatory Visit (INDEPENDENT_AMBULATORY_CARE_PROVIDER_SITE_OTHER): Payer: BLUE CROSS/BLUE SHIELD

## 2015-12-10 DIAGNOSIS — Z1211 Encounter for screening for malignant neoplasm of colon: Secondary | ICD-10-CM

## 2015-12-10 LAB — FECAL OCCULT BLOOD, IMMUNOCHEMICAL: Fecal Occult Bld: NEGATIVE

## 2016-01-05 ENCOUNTER — Encounter: Payer: Self-pay | Admitting: Internal Medicine

## 2016-03-09 ENCOUNTER — Encounter: Payer: Self-pay | Admitting: Family Medicine

## 2016-03-10 ENCOUNTER — Other Ambulatory Visit: Payer: Self-pay | Admitting: Family Medicine

## 2016-03-10 DIAGNOSIS — E2839 Other primary ovarian failure: Secondary | ICD-10-CM

## 2016-03-22 ENCOUNTER — Encounter: Payer: Self-pay | Admitting: Family Medicine

## 2016-03-22 ENCOUNTER — Ambulatory Visit (INDEPENDENT_AMBULATORY_CARE_PROVIDER_SITE_OTHER)
Admission: RE | Admit: 2016-03-22 | Discharge: 2016-03-22 | Disposition: A | Discharge status: Home / Self Care | Payer: BLUE CROSS/BLUE SHIELD | Source: Ambulatory Visit | Attending: Family Medicine | Admitting: Family Medicine

## 2016-03-22 DIAGNOSIS — E2839 Other primary ovarian failure: Secondary | ICD-10-CM | POA: Diagnosis not present

## 2016-03-23 ENCOUNTER — Other Ambulatory Visit: Payer: Self-pay | Admitting: Family Medicine

## 2016-03-23 MED ORDER — TRIAMCINOLONE ACETONIDE 0.1 % MT PSTE: 1.0000 "application " | PASTE | Freq: Two times a day (BID) | OROMUCOSAL | Status: DC | PRN

## 2016-03-29 ENCOUNTER — Encounter: Payer: Self-pay | Admitting: *Deleted

## 2016-06-24 ENCOUNTER — Encounter: Payer: Self-pay | Admitting: Family Medicine

## 2016-06-30 ENCOUNTER — Ambulatory Visit (INDEPENDENT_AMBULATORY_CARE_PROVIDER_SITE_OTHER)
Admission: RE | Admit: 2016-06-30 | Discharge: 2016-06-30 | Disposition: A | Discharge status: Home / Self Care | Payer: BLUE CROSS/BLUE SHIELD | Source: Ambulatory Visit | Attending: Family Medicine | Admitting: Family Medicine

## 2016-06-30 ENCOUNTER — Ambulatory Visit (INDEPENDENT_AMBULATORY_CARE_PROVIDER_SITE_OTHER): Payer: BLUE CROSS/BLUE SHIELD | Admitting: Family Medicine

## 2016-06-30 ENCOUNTER — Encounter: Payer: Self-pay | Admitting: Family Medicine

## 2016-06-30 VITALS — BP 120/70 | HR 70 | Temp 98.6°F | Ht 67.0 in | Wt 147.5 lb

## 2016-06-30 DIAGNOSIS — M4120 Other idiopathic scoliosis, site unspecified: Secondary | ICD-10-CM

## 2016-06-30 DIAGNOSIS — M47816 Spondylosis without myelopathy or radiculopathy, lumbar region: Secondary | ICD-10-CM | POA: Diagnosis not present

## 2016-06-30 DIAGNOSIS — M47812 Spondylosis without myelopathy or radiculopathy, cervical region: Secondary | ICD-10-CM | POA: Diagnosis not present

## 2016-06-30 DIAGNOSIS — M4184 Other forms of scoliosis, thoracic region: Secondary | ICD-10-CM | POA: Diagnosis not present

## 2016-06-30 NOTE — Progress Notes (Signed)
Pre visit review using our clinic review tool, if applicable. No additional management support is needed unless otherwise documented below in the visit note.  Lump on her back.  On R side of back.  Present for about 6 months.  It isn't tender.    She has started to notice some partial numbness (she can still feel, but not normally) in the R arm, tingling.  No L sided sx.  One episode of weakness, reaching up for dishes.  Has since been able to reach up w/o weakness.  No other trauma.  No neck pain.  No trouble with R shoulder/elbow/wrist ROM.  No FCNAVD.  No tingling or weakness in the feet.    Meds, vitals, and allergies reviewed.   ROS: Per HPI unless specifically indicated in ROS section   nad Neck supple, no LA No midline back or neck pain rrr ctab S/s/dtr wnl BUE. Normal rom shoulders elbows and wrists.  NV intact BUE Scoliosis noted in back, L deviation of T spine and R deviation of L spine.   This accounts for the "mass" on the R side of lower/mid back.  No specific mass o/w.

## 2016-06-30 NOTE — Patient Instructions (Signed)
Go to the lab on the way out.  We'll contact you with your xray report. We'll go from there.  Take care.  Glad to see you.  

## 2016-07-01 ENCOUNTER — Other Ambulatory Visit: Payer: Self-pay | Admitting: Family Medicine

## 2016-07-01 DIAGNOSIS — M4120 Other idiopathic scoliosis, site unspecified: Secondary | ICD-10-CM

## 2016-07-01 NOTE — Assessment & Plan Note (Signed)
Seems to be worsening now. This accounts for the "mass" on her back.  D/w pt.  Check plain films and we'll notify pt.  It is unclear to me at this point if her R arm sx are related to compensation from the scoliosis or a primary issue in the neck, ie foraminal narrowing.  D/w pt.  See notes on imaging.  She agrees.  Still okay for outpatient f/u.

## 2016-07-07 DIAGNOSIS — M545 Low back pain: Secondary | ICD-10-CM | POA: Diagnosis not present

## 2016-07-09 DIAGNOSIS — M545 Low back pain: Secondary | ICD-10-CM | POA: Diagnosis not present

## 2016-07-12 DIAGNOSIS — M545 Low back pain: Secondary | ICD-10-CM | POA: Diagnosis not present

## 2016-07-14 DIAGNOSIS — M545 Low back pain: Secondary | ICD-10-CM | POA: Diagnosis not present

## 2016-07-19 DIAGNOSIS — M545 Low back pain: Secondary | ICD-10-CM | POA: Diagnosis not present

## 2016-07-22 DIAGNOSIS — M545 Low back pain: Secondary | ICD-10-CM | POA: Diagnosis not present

## 2016-07-26 DIAGNOSIS — M545 Low back pain: Secondary | ICD-10-CM | POA: Diagnosis not present

## 2016-07-29 DIAGNOSIS — M545 Low back pain: Secondary | ICD-10-CM | POA: Diagnosis not present

## 2017-04-04 ENCOUNTER — Encounter: Payer: Self-pay | Admitting: Family Medicine

## 2017-04-04 ENCOUNTER — Ambulatory Visit (INDEPENDENT_AMBULATORY_CARE_PROVIDER_SITE_OTHER): Payer: BLUE CROSS/BLUE SHIELD | Admitting: Family Medicine

## 2017-04-04 VITALS — BP 102/68 | HR 74 | Temp 98.1°F | Wt 150.1 lb

## 2017-04-04 DIAGNOSIS — R109 Unspecified abdominal pain: Secondary | ICD-10-CM | POA: Diagnosis not present

## 2017-04-04 DIAGNOSIS — N3090 Cystitis, unspecified without hematuria: Secondary | ICD-10-CM | POA: Diagnosis not present

## 2017-04-04 LAB — POCT URINALYSIS DIPSTICK
Bilirubin, UA: NEGATIVE
GLUCOSE UA: NEGATIVE
Ketones, UA: NEGATIVE
Nitrite, UA: NEGATIVE
SPEC GRAV UA: 1.015 (ref 1.010–1.025)
Urobilinogen, UA: 0.2 E.U./dL
pH, UA: 6 (ref 5.0–8.0)

## 2017-04-04 MED ORDER — CEPHALEXIN 500 MG PO CAPS
500.0000 mg | ORAL_CAPSULE | Freq: Three times a day (TID) | ORAL | 0 refills | Status: DC
Start: 2017-04-04 — End: 2017-08-18

## 2017-04-04 NOTE — Patient Instructions (Signed)

## 2017-04-04 NOTE — Progress Notes (Signed)
   Subjective:    Patient ID: [name], female    DOB: 09-06-1946, 71 y.o.   MRN: [id number]  HPI This is a 71 yo female who presents today with back pian x 4 days, cloudy, malodorous urine. Some lower abdominal pressure, mild nausea, no fever. No dysuria, no frequency.   Past Medical History:  Diagnosis Date  . Anemia    hx  . Arthritis   . History of DVT (deep vein thrombosis)    on hormones   . Left inguinal hernia October 2012  . Lupus anticoagulant positive    Past Surgical History:  Procedure Laterality Date  . FOOT SURGERY  09/21/2005, 2009, 2011   both feet  . INGUINAL HERNIA REPAIR  11/05/2011   Procedure: HERNIA REPAIR INGUINAL ADULT;  Surgeon: Wilmon Arms. Corliss Skains, MD;  Location: [facility] OR;  Service: General;  Laterality: Left;  LEFT INGUINAL HERNIA REPAIR WITH MESH  . TUBAL LIGATION     81   Family History  Problem Relation Age of Onset  . Heart disease Father   . Colon cancer Neg Hx   . Breast cancer Neg Hx    Social History  Substance Use Topics  . Smoking status: Never Smoker  . Smokeless tobacco: Never Used  . Alcohol use 0.0 oz/week    3 - 4 Glasses of wine per week     Comment: 4 glasses of wine weekly      Review of Systems Per HPI    Objective:   Physical Exam  Constitutional: She appears well-developed and well-nourished.  HENT:  Head: Normocephalic and atraumatic.  Eyes: Conjunctivae are normal.  Neck: Normal range of motion. Neck supple.  Cardiovascular: Normal rate, regular rhythm and normal heart sounds.   Pulmonary/Chest: Effort normal and breath sounds normal.  Abdominal: Soft. She exhibits no distension. There is no tenderness. There is no rebound, no guarding and no CVA tenderness.  Vitals reviewed.     BP 102/68 (BP Location: Left Arm, Patient Position: Sitting, Cuff Size: Normal)   Pulse 74   Temp 98.1 F (36.7 C) (Oral)   Wt 150 lb 2 oz (68.1 kg)   SpO2 97%   BMI 23.51 kg/m  Wt Readings from Last 3 Encounters:    04/04/17 150 lb 2 oz (68.1 kg)  06/30/16 147 lb 8 oz (66.9 kg)  10/23/15 141 lb 12 oz (64.3 kg)   Results for orders placed or performed in visit on 04/04/17  POCT urinalysis dipstick  Result Value Ref Range   Color, UA YELLOW    Clarity, UA CLOUDY    Glucose, UA NEG    Bilirubin, UA NEG    Ketones, UA NEG    Spec Grav, UA 1.015 1.010 - 1.025   Blood, UA 1+    pH, UA 6.0 5.0 - 8.0   Protein, UA 1+    Urobilinogen, UA 0.2 0.2 or 1.0 E.U./dL   Nitrite, UA NEG    Leukocytes, UA Large (3+) (A) Negative       Assessment & Plan:  1. Flank pain - POCT urinalysis dipstick  2. Cystitis - Provided written and verbal information regarding diagnosis and treatment. - RTC instructions reveiwed - Urine culture - cephALEXin (KEFLEX) 500 MG capsule; Take 1 capsule (500 mg total) by mouth 3 (three) times daily.  Dispense: 21 capsule; Refill: 0   Olean Ree, FNP-BC  [facility], [facility]  04/04/2017 2:52 PM

## 2017-04-04 NOTE — Progress Notes (Signed)
Pre visit review using our clinic review tool, if applicable. No additional management support is needed unless otherwise documented below in the visit note. 

## 2017-04-06 LAB — URINE CULTURE

## 2017-04-11 ENCOUNTER — Telehealth: Payer: Self-pay | Admitting: Family Medicine

## 2017-04-11 NOTE — Telephone Encounter (Signed)
Pt will call back to schedule AWV and cpe

## 2017-08-18 ENCOUNTER — Encounter: Payer: Self-pay | Admitting: Family Medicine

## 2017-08-18 ENCOUNTER — Ambulatory Visit (INDEPENDENT_AMBULATORY_CARE_PROVIDER_SITE_OTHER): Payer: BLUE CROSS/BLUE SHIELD | Admitting: Family Medicine

## 2017-08-18 DIAGNOSIS — N3090 Cystitis, unspecified without hematuria: Secondary | ICD-10-CM

## 2017-08-18 LAB — POC URINALSYSI DIPSTICK (AUTOMATED)
Bilirubin, UA: NEGATIVE
Glucose, UA: NEGATIVE
Ketones, UA: NEGATIVE
Nitrite, UA: POSITIVE
Protein, UA: NEGATIVE
RBC UA: NEGATIVE
SPEC GRAV UA: 1.015 (ref 1.010–1.025)
UROBILINOGEN UA: 0.2 U/dL
pH, UA: 6 (ref 5.0–8.0)

## 2017-08-18 MED ORDER — CEPHALEXIN 500 MG PO CAPS: 500.0000 mg | ORAL_CAPSULE | Freq: Three times a day (TID) | ORAL | 0 refills | Status: DC

## 2017-08-18 NOTE — Patient Instructions (Signed)
Drink plenty of water and start the antibiotics today.  We'll contact you with your lab report.  Take care.   

## 2017-08-18 NOTE — Assessment & Plan Note (Signed)
Presumed, no CVA pain.  Nontoxic.  Start keflex, okay for outpatient f/u.  Check ucx.  She agrees.  See AVS.

## 2017-08-18 NOTE — Progress Notes (Signed)
Dysuria: cloudy urine, discolored, abnormal smell, worse in the last week duration of symptoms: about 3 days abdominal pain: no Fevers:temp up to 101 in the nights recently, the last 3 night back pain: some lower back pain.  Vomiting: no No diarrhea.    No cough. No ST.  No BLE edema.    Meds, vitals, and allergies reviewed.   Per HPI unless specifically indicated in ROS section   GEN: nad, alert and oriented NECK: supple CV: rrr.  PULM: ctab, no inc wob ABD: soft, +bs, suprapubic area not tender EXT: no edema BACK: no CVA pain R lower back is sore at baseline but not sore to the touch

## 2017-08-18 NOTE — Addendum Note (Signed)
Addended by: Annamarie MajorFUQUAY, Viktoriya Glaspy S on: 08/18/2017 09:58 AM   Modules accepted: Orders

## 2017-08-21 ENCOUNTER — Other Ambulatory Visit: Payer: Self-pay | Admitting: Family Medicine

## 2017-08-21 LAB — URINE CULTURE
MICRO NUMBER:: 81012301
SPECIMEN QUALITY:: ADEQUATE

## 2017-08-21 MED ORDER — NITROFURANTOIN MONOHYD MACRO 100 MG PO CAPS: 100.0000 mg | ORAL_CAPSULE | Freq: Two times a day (BID) | ORAL | 0 refills | Status: DC

## 2017-08-23 ENCOUNTER — Telehealth: Payer: Self-pay

## 2017-08-23 MED ORDER — NITROFURANTOIN MONOHYD MACRO 100 MG PO CAPS: 100.0000 mg | ORAL_CAPSULE | Freq: Two times a day (BID) | ORAL | 0 refills | Status: DC

## 2017-08-23 NOTE — Telephone Encounter (Signed)
Pt said [facility] did not have macrobid in stock and was not sure when or if they could get it. I spoke with [facility] [place] [facility] and they do have med; sent electronically to [facility] and pt voiced understanding. Cancelled [facility] with v/m.

## 2017-09-12 IMAGING — DX DG CERVICAL SPINE COMPLETE 4+V
5 series · 5 of 5 positions shown · non-contrast
Comparison: No recent prior.

CLINICAL DATA: Scoliosis.

EXAM:
CERVICAL SPINE - COMPLETE 4+ VIEW

[c-spine lat]
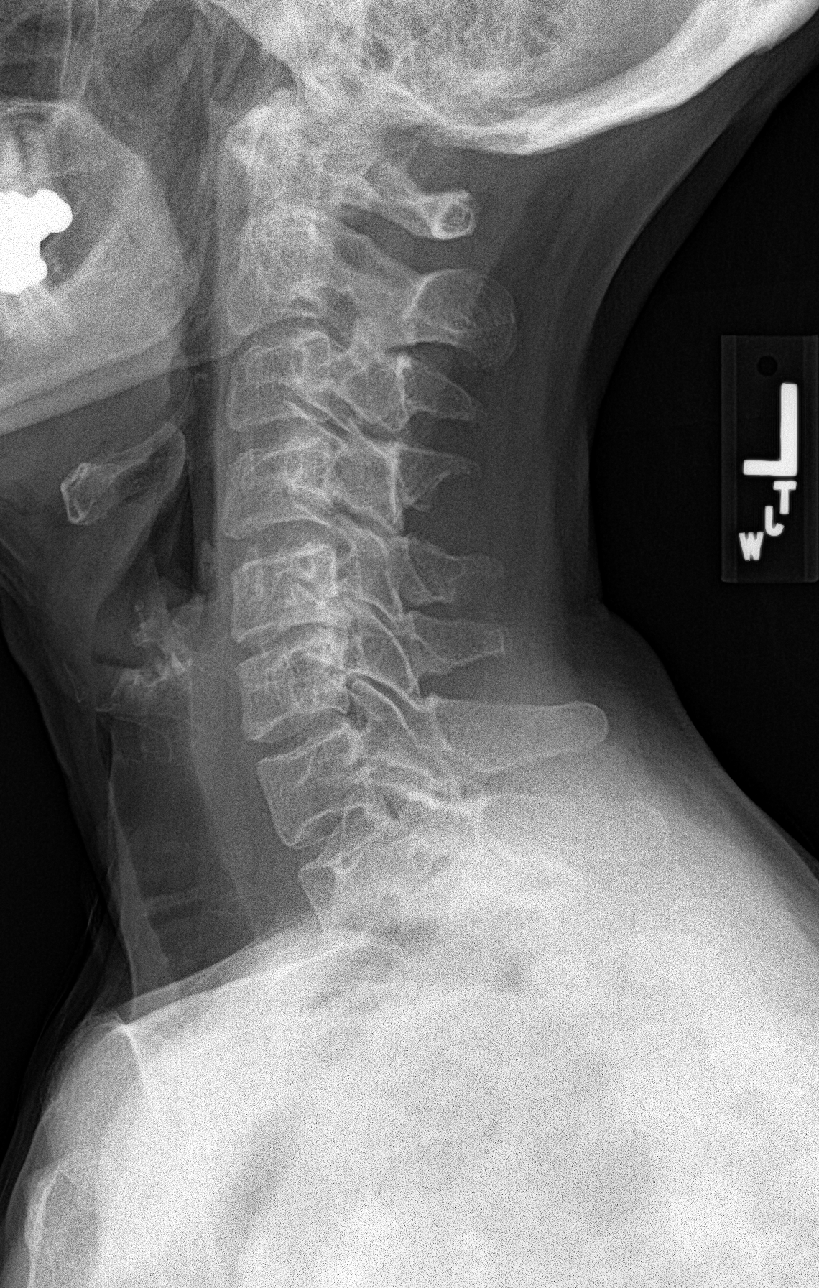

[c-spine obl (1 of 2)]
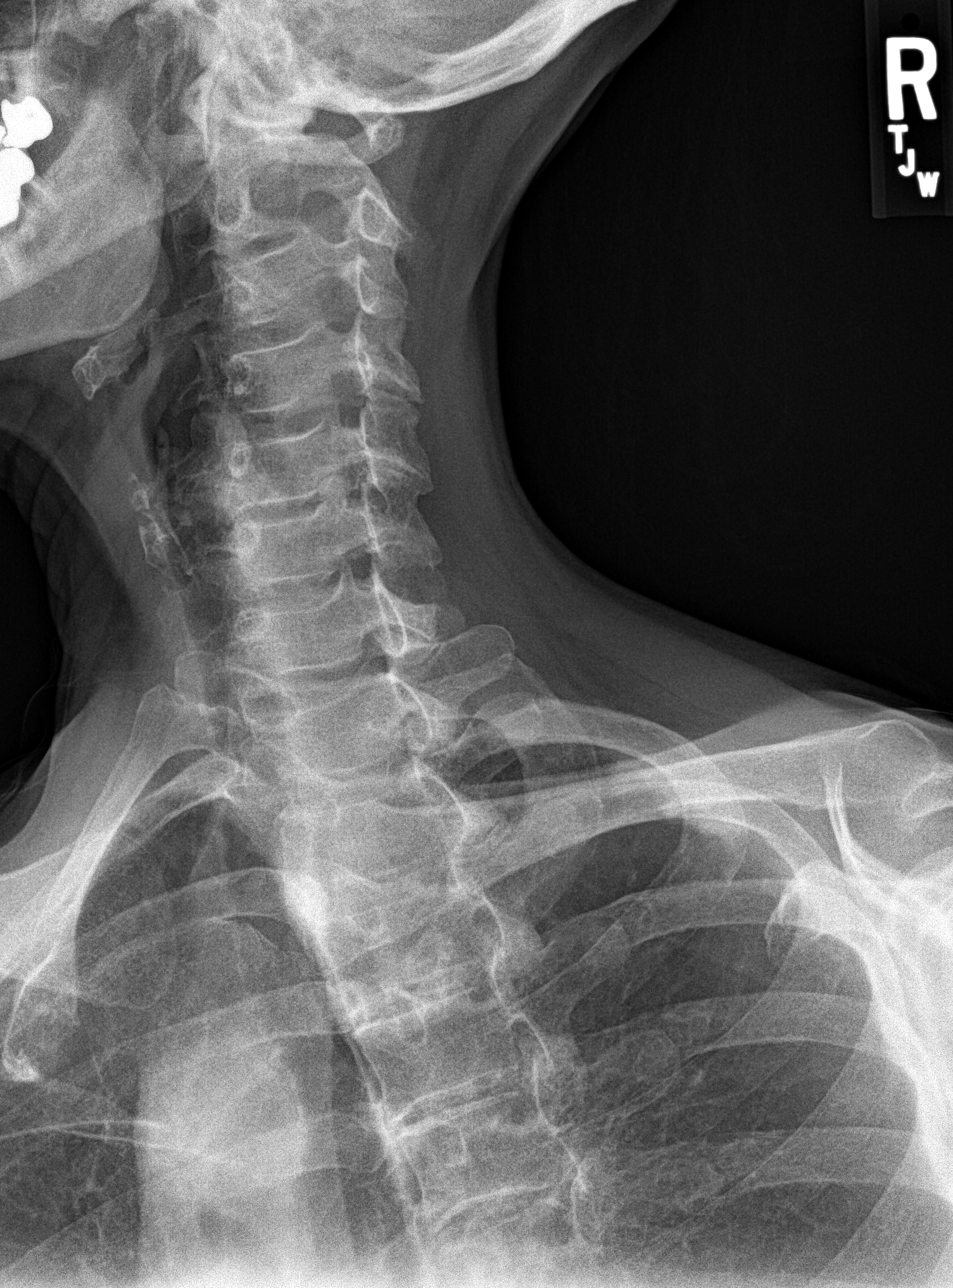

[c-spine obl (2 of 2)]
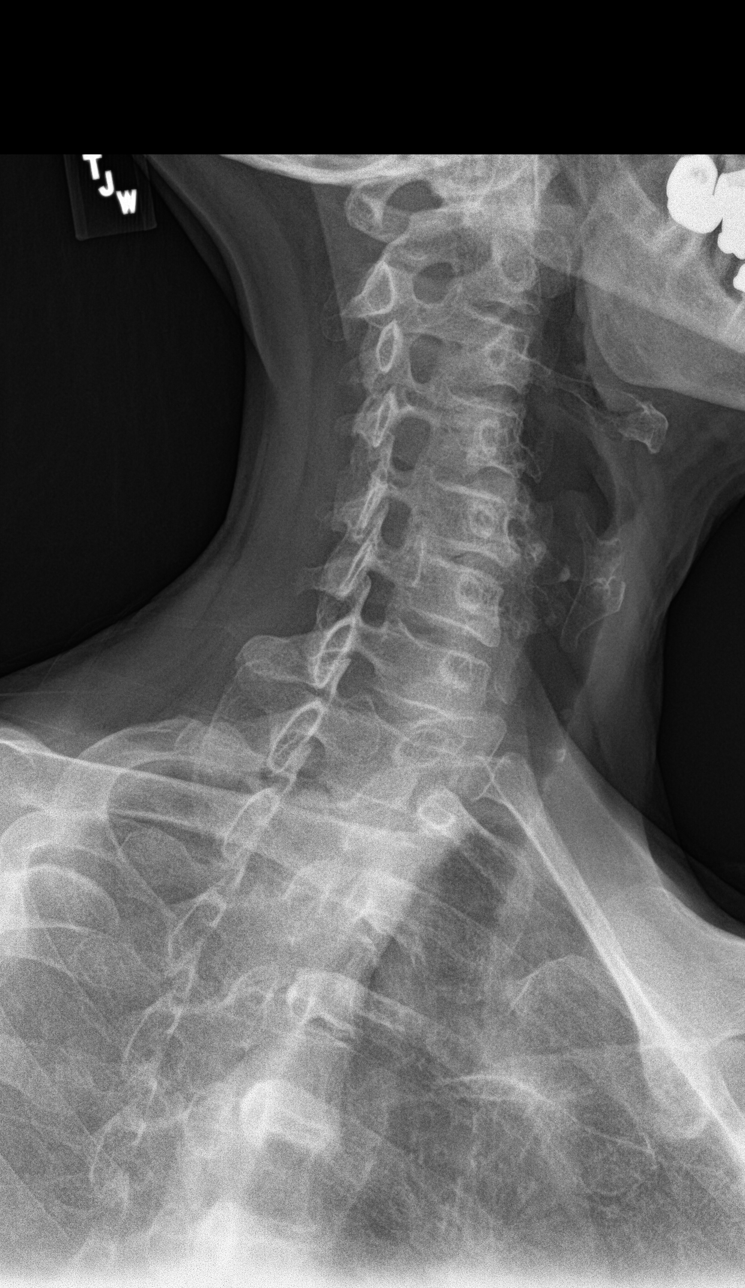

[c-spine ap]
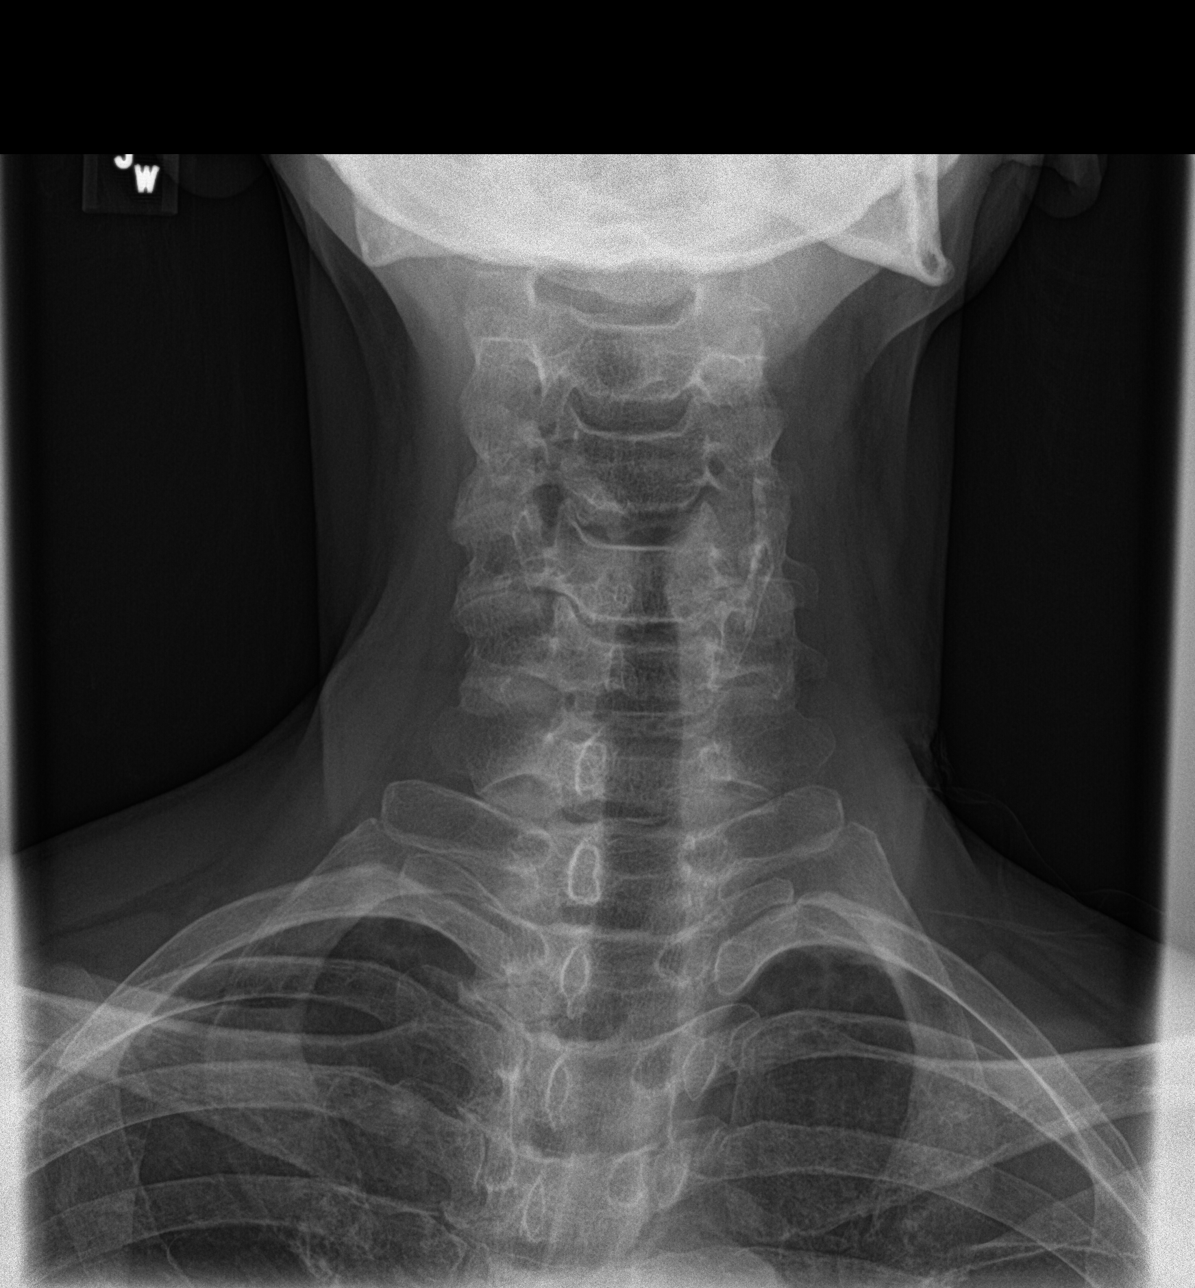

[c-spine open mouth]
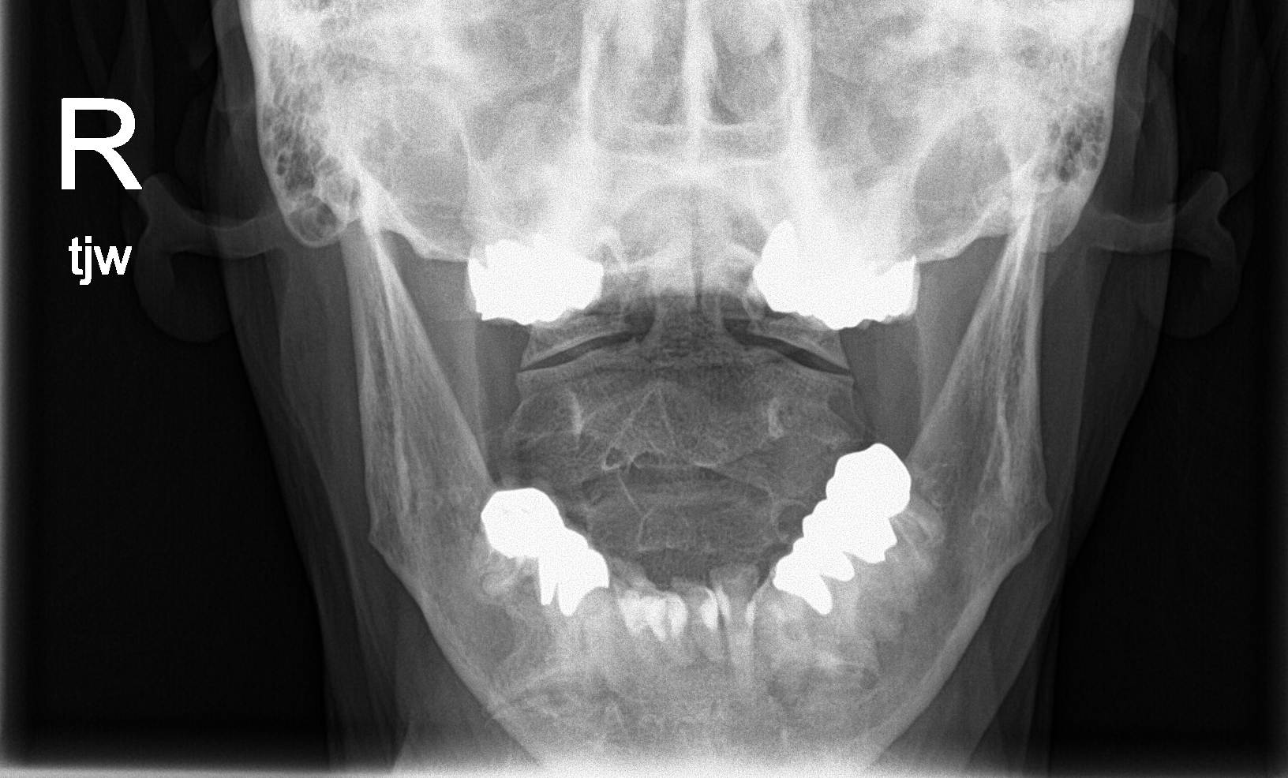

[5 of 5 positions shown; findings below may reference images not displayed]

FINDINGS: Multilevel degenerative change. No acute abnormality identified.
Pulmonary apices are clear.
IMPRESSION: Diffuse multilevel degenerative change. No acute abnormality
identified.

## 2017-09-12 IMAGING — DX DG LUMBAR SPINE COMPLETE 4+V
5 series · 5 of 5 positions shown · non-contrast
Comparison: None

CLINICAL DATA: Evaluate for scoliosis

EXAM:
LUMBAR SPINE - COMPLETE 4+ VIEW

[l-spine ap]
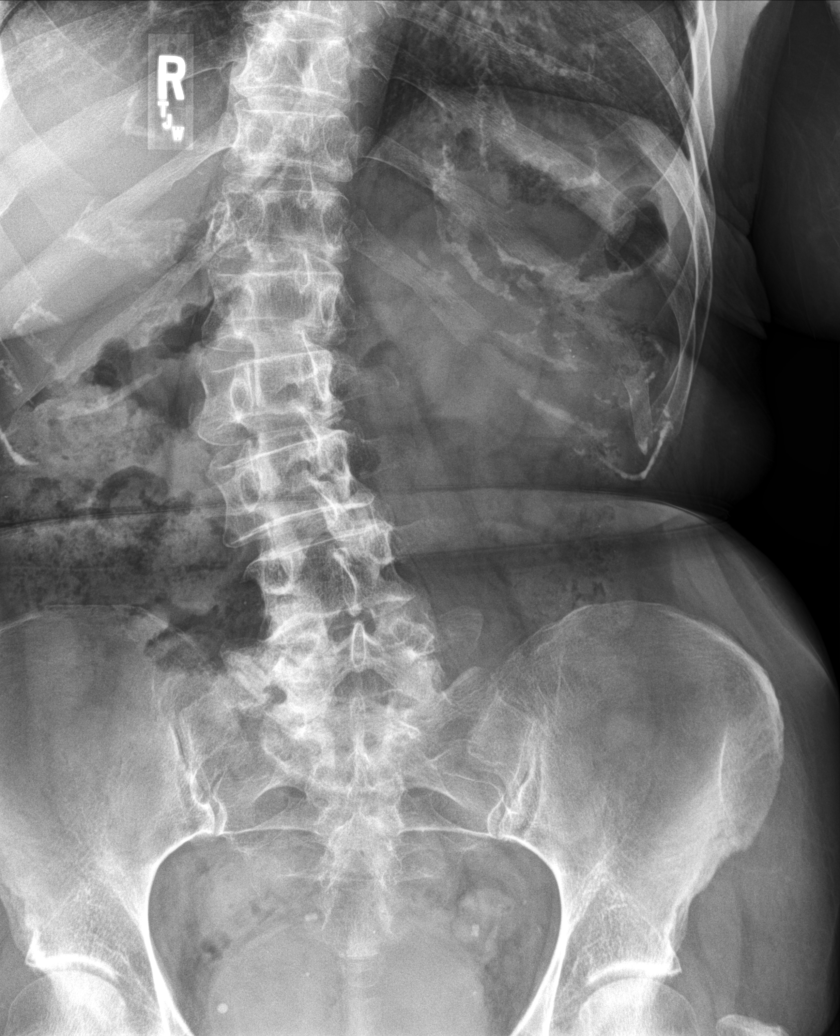

[l-spine obl (1 of 2)]
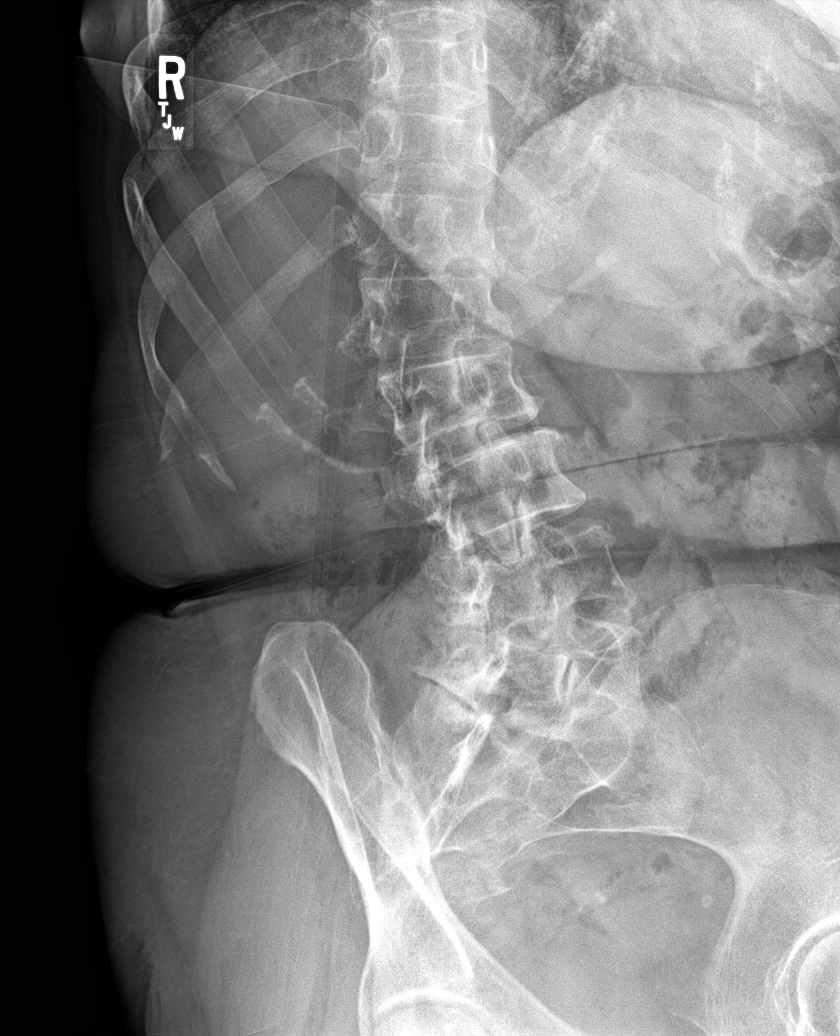

[l-spine obl (2 of 2)]
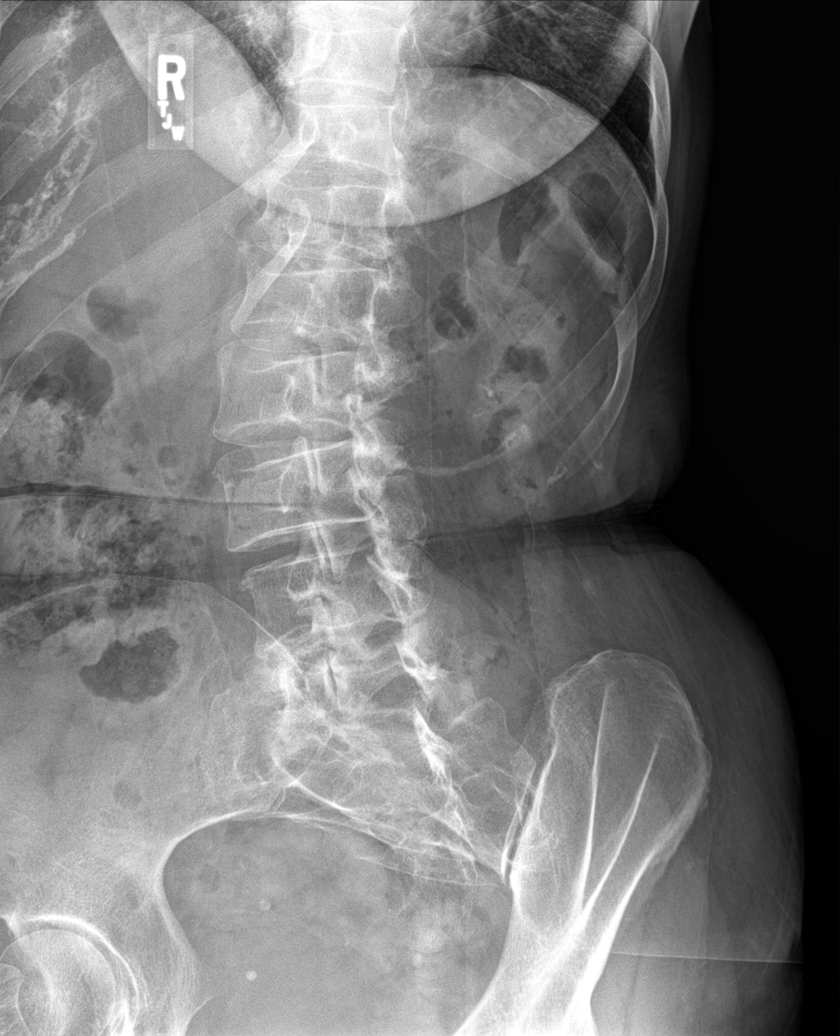

[l-spine lat]
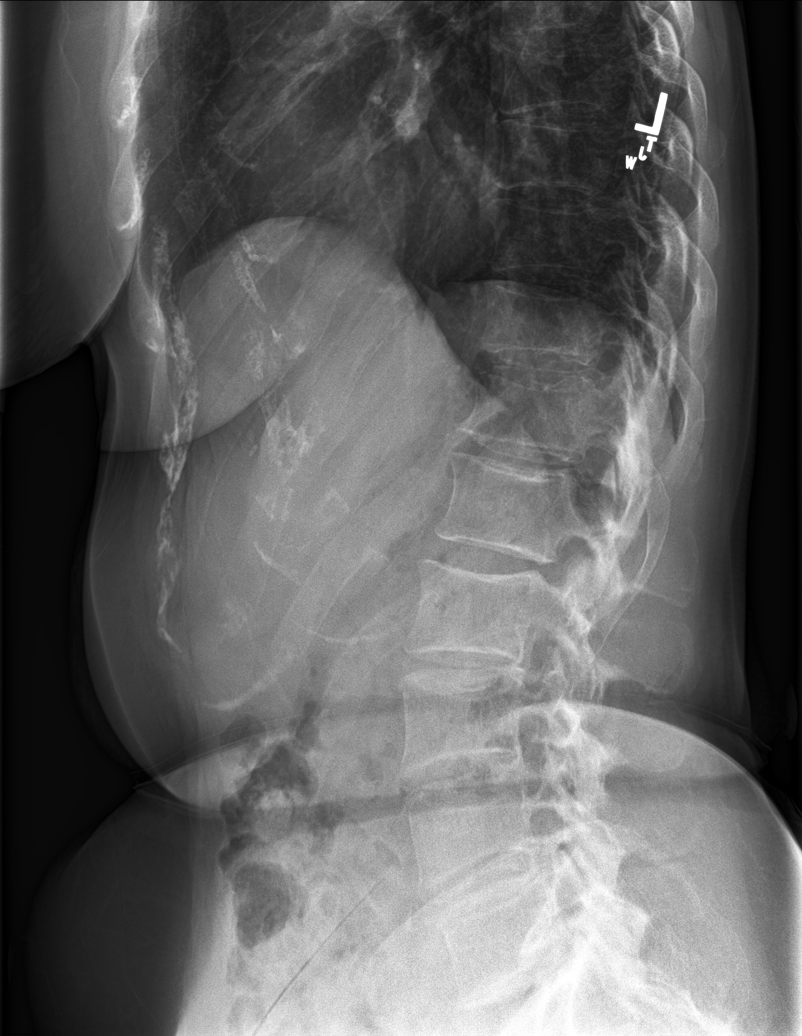

[l-spine l5/s1]
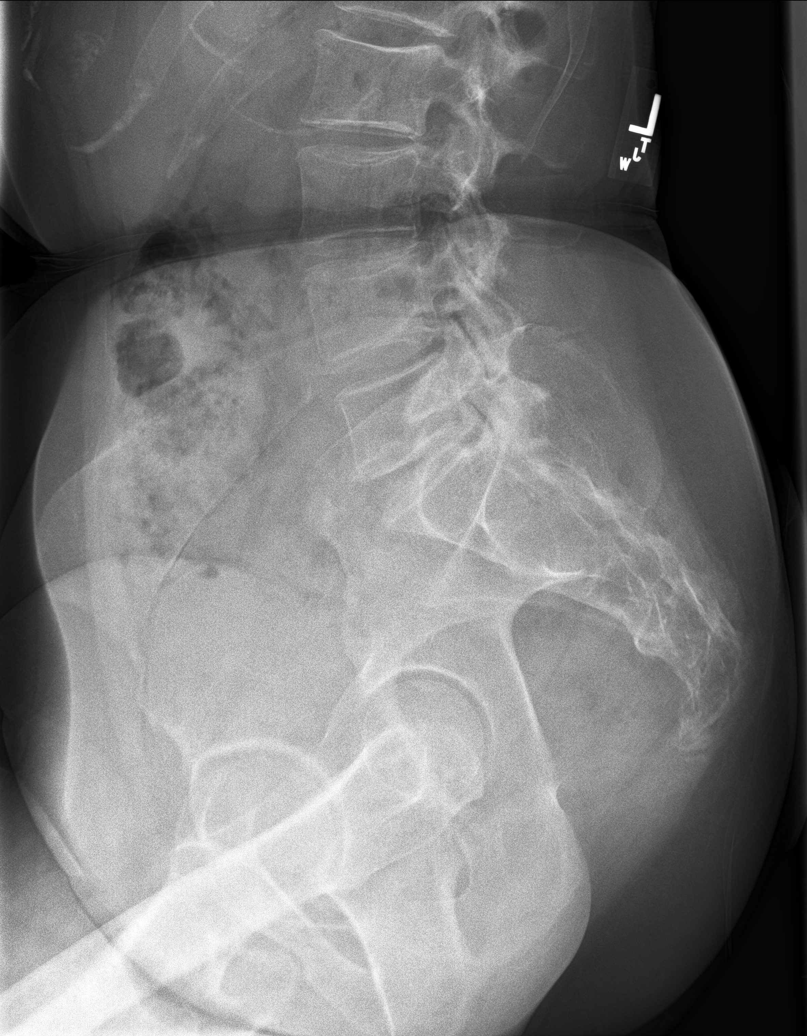

[5 of 5 positions shown; findings below may reference images not displayed]

FINDINGS: There is a lumbar scoliosis convex to the right by approximately 19
degrees. Intervertebral disc spaces of the lumbar spine are
relatively well preserved. There is some degenerative change
involving the facet joints particularly of L5-S1. The SI joints are
corticated.
IMPRESSION: 1. Lumbar scoliosis convex to the right by 19 degrees.
2. Degenerative change of the facet joints of L5-S1.

## 2018-02-20 ENCOUNTER — Ambulatory Visit (INDEPENDENT_AMBULATORY_CARE_PROVIDER_SITE_OTHER): Payer: BLUE CROSS/BLUE SHIELD | Admitting: Family Medicine

## 2018-02-20 ENCOUNTER — Encounter: Payer: Self-pay | Admitting: Family Medicine

## 2018-02-20 VITALS — BP 128/76 | HR 88 | Temp 98.2°F | Ht 67.0 in | Wt 153.8 lb

## 2018-02-20 DIAGNOSIS — S61412A Laceration without foreign body of left hand, initial encounter: Secondary | ICD-10-CM | POA: Diagnosis not present

## 2018-02-20 DIAGNOSIS — Z23 Encounter for immunization: Secondary | ICD-10-CM

## 2018-02-20 DIAGNOSIS — W19XXXA Unspecified fall, initial encounter: Secondary | ICD-10-CM | POA: Diagnosis not present

## 2018-02-20 DIAGNOSIS — S0181XA Laceration without foreign body of other part of head, initial encounter: Secondary | ICD-10-CM | POA: Diagnosis not present

## 2018-02-20 NOTE — Assessment & Plan Note (Signed)
Small after a fall (with ecchymosis)  Hemostasis with pressure  Cleaned /flushed 2 steri strips /abx oint and band aid applied Disc s/s of head inj to watch for  Wound care discussed

## 2018-02-20 NOTE — Assessment & Plan Note (Signed)
1.5 cm L hand just above web space between 2,3 fingers  Simple superficial lac on inspection -nl sens/perf of hand  Cleaned/prepped in sterile fashion- anesth with 2.5 cc of 1% xylocaine w/o epi  3 simple interruped sutures placed  Pt tolerated well  Dressed with gauze Wound care discussed  F/u 10 d for suture removal  Will watch for s/s of infection  Tdap update today

## 2018-02-20 NOTE — Progress Notes (Signed)
Subjective:    Patient ID: [name], female    DOB: 1945/12/18, 72 y.o.   MRN: [id number]  HPI Here for injuries sustained in a fall at [employer]  just now   Missed a step  Going across a parking lot   Caught herself with both hands- L one moreso   Her chin hit the concrete   No LOC  No headache or dizziness Feels fine now  Cleaned wounds with rubbing alcohol and applied band aides before coming here   Does not tend to fall -this was unusual for her   72 yo pt of Dr Para March  She is on 81 mg asa   Td was 2011  Does have a newborn due in the family in the summer/has not had Tdap    Patient Active Problem List   Diagnosis Date Noted  . Laceration of left hand 02/20/2018  . Laceration of chin 02/20/2018  . Cystitis 08/18/2017  . Idiopathic scoliosis 10/27/2015  . Advance care planning 10/24/2015  . Anemia 10/24/2015  . Rash and nonspecific skin eruption 06/11/2014  . Medicare annual wellness visit, initial 01/22/2014  . Sjogren's syndrome (HCC) 03/25/2013  . Osteopenia 07/05/2007  . DVT, HX OF 07/05/2007   Past Medical History:  Diagnosis Date  . Anemia    hx  . Arthritis   . History of DVT (deep vein thrombosis)    on hormones   . Left inguinal hernia October 2012  . Lupus anticoagulant positive    Past Surgical History:  Procedure Laterality Date  . FOOT SURGERY  09/21/2005, 2009, 2011   both feet  . INGUINAL HERNIA REPAIR  11/05/2011   Procedure: HERNIA REPAIR INGUINAL ADULT;  Surgeon: Wilmon Arms. Corliss Skains, MD;  Location: [facility] OR;  Service: General;  Laterality: Left;  LEFT INGUINAL HERNIA REPAIR WITH MESH  . TUBAL LIGATION     81   Social History   Tobacco Use  . Smoking status: Never Smoker  . Smokeless tobacco: Never Used  Substance Use Topics  . Alcohol use: Yes    Alcohol/week: 0.0 oz    Types: 3 - 4 Glasses of wine per week    Comment: 4 glasses of wine weekly  . Drug use: No   Family History  Problem Relation Age of Onset  . Heart  disease Father   . Colon cancer Neg Hx   . Breast cancer Neg Hx    Allergies  Allergen Reactions  . Amoxicillin-Pot Clavulanate     Leg cramps  . Ciprofloxacin     Leg cramps  . Hydroxychloroquine Sulfate     Facial hives   Current Outpatient Medications on File Prior to Visit  Medication Sig Dispense Refill  . aspirin EC 81 MG tablet Take 81 mg by mouth daily.      . calcium carbonate (OS-CAL) 600 MG TABS Take 600 mg by mouth 2 (two) times daily with a meal.      . magnesium oxide (MAG-OX) 400 MG tablet Take 400 mg by mouth daily.    . Multiple Vitamin (MULTIVITAMIN) tablet Take 1 tablet by mouth daily.       Current Facility-Administered Medications on File Prior to Visit  Medication Dose Route Frequency Provider Last Rate Last Dose  . ceFAZolin (ANCEF) IVPB 2 g/50 mL premix  2 g Intravenous 60 min Pre-Op Manus Rudd, MD        Review of Systems  Constitutional: Negative for activity change, appetite change, fatigue, fever and  unexpected weight change.  HENT: Negative for congestion, ear pain, rhinorrhea, sinus pressure and sore throat.   Eyes: Negative for pain, redness and visual disturbance.  Respiratory: Negative for cough, shortness of breath and wheezing.   Cardiovascular: Negative for chest pain and palpitations.  Gastrointestinal: Negative for abdominal pain, blood in stool, constipation and diarrhea.  Endocrine: Negative for polydipsia and polyuria.  Genitourinary: Negative for dysuria, frequency and urgency.  Musculoskeletal: Negative for arthralgias, back pain and myalgias.  Skin: Positive for wound. Negative for pallor and rash.  Allergic/Immunologic: Negative for environmental allergies.  Neurological: Negative for dizziness, tremors, syncope, facial asymmetry, speech difficulty, weakness, light-headedness and headaches.  Hematological: Negative for adenopathy. Does not bruise/bleed easily.  Psychiatric/Behavioral: Negative for decreased concentration and  dysphoric mood. The patient is not nervous/anxious.        Objective:   Physical Exam  Constitutional: She appears well-developed and well-nourished.  Well appearing   HENT:  Head: Normocephalic.  Small abrasion on bridge of nose w/o tenderness  0.5 cm laceration/tear on chin (triangle shape)- slightly oozing blood after pressure  Ecchymosis surrounds this   Eyes: Conjunctivae and EOM are normal. Pupils are equal, round, and reactive to light. Right eye exhibits no discharge. Left eye exhibits no discharge. No scleral icterus.  No orbital trauma   Neck: Normal range of motion. Neck supple.  Cardiovascular: Normal rate and regular rhythm.  Musculoskeletal: She exhibits no edema or deformity.  Abrasions on both hands Laceration of L hand - just above web space of 2,3rd fingers  No swelling or bony tenderness Changes of OA  Neurological: She is alert. No cranial nerve deficit. She exhibits normal muscle tone. Coordination normal.  Skin: Skin is warm and dry.  0.5 cm laceration on chin - over ecchymosis  Triangle shaped- (some skin tear)  Oozing slightly   1.5 cm laceration just above web space (2,3rd finger) -superficial and oozing  No apparent tendon involvement  No erythema   Superficial abrasions on face, L knee and both hands   Psychiatric: She has a normal mood and affect. Her mood appears not anxious. Her affect is not blunt and not labile. Cognition and memory are normal. She does not exhibit a depressed mood.  Lucid- talkative and in a good mood           Assessment & Plan:   Problem List Items Addressed This Visit      Other   Fall    Trip and fall  Sustaining abrasions on hands and L knee Laceration chin and L hand  Addressed injuries-otherwise reassuring exam  Disc fall prev in the future Disc s/s head inj to watch for as well      Laceration of chin    Small after a fall (with ecchymosis)  Hemostasis with pressure  Cleaned /flushed 2 steri strips  /abx oint and band aid applied Disc s/s of head inj to watch for  Wound care discussed       Relevant Orders   Tdap vaccine greater than or equal to 7yo IM (Completed)   Laceration of left hand - Primary    1.5 cm L hand just above web space between 2,3 fingers  Simple superficial lac on inspection -nl sens/perf of hand  Cleaned/prepped in sterile fashion- anesth with 2.5 cc of 1% xylocaine w/o epi  3 simple interruped sutures placed  Pt tolerated well  Dressed with gauze Wound care discussed  F/u 10 d for suture removal  Will watch for  s/s of infection  Tdap update today      Relevant Orders   Tdap vaccine greater than or equal to 7yo IM (Completed)

## 2018-02-20 NOTE — Patient Instructions (Signed)
If you get a headache or dizziness or nausea please call  Tylenol for discomfort  Keep areas clean with soap and water  Antibiotic ointment is fine  Do not submerge wounds  Gloves for dish washing   Follow up in 10 days for suture removal   Any problems before then   Tetanus shot today -- Tdap today

## 2018-02-20 NOTE — Assessment & Plan Note (Signed)
Trip and fall  Sustaining abrasions on hands and L knee Laceration chin and L hand  Addressed injuries-otherwise reassuring exam  Disc fall prev in the future Disc s/s head inj to watch for as well

## 2018-03-02 ENCOUNTER — Encounter: Payer: Self-pay | Admitting: Family Medicine

## 2018-03-02 ENCOUNTER — Ambulatory Visit (INDEPENDENT_AMBULATORY_CARE_PROVIDER_SITE_OTHER): Payer: BLUE CROSS/BLUE SHIELD | Admitting: Family Medicine

## 2018-03-02 DIAGNOSIS — S61412D Laceration without foreign body of left hand, subsequent encounter: Secondary | ICD-10-CM

## 2018-03-02 NOTE — Progress Notes (Signed)
No post concussive sx in the interval.  No headaches.  No numbness. Here for suture removal.   Able to make a fist.  No complaints now.   Meds, vitals, and allergies reviewed.   ROS: Per HPI unless specifically indicated in ROS section   nad Chin abrasion is healed. ncat Left hand with laceration site in the webspace healing.  3 sutures removed easily.  No complication.  The area is not easily covered with Steri-Strips.  I covered it with a nonstick bandage and wrapped with Coban.  This is likely the best option.  She still has good tissue apposition.   Normal hand range of motion and distally neurovascular intact.

## 2018-03-02 NOTE — Patient Instructions (Signed)
No charge for visit.  Keep the area clean and covered and update me as needed.  Take care.  Glad to see you.

## 2018-03-03 NOTE — Assessment & Plan Note (Signed)
Sutures removed easily.  See above.  No complication.  No charge.  Update us as needed.

## 2018-09-08 DIAGNOSIS — Z23 Encounter for immunization: Secondary | ICD-10-CM | POA: Diagnosis not present

## 2019-11-20 DIAGNOSIS — N3000 Acute cystitis without hematuria: Secondary | ICD-10-CM | POA: Diagnosis not present

## 2019-11-20 DIAGNOSIS — R350 Frequency of micturition: Secondary | ICD-10-CM | POA: Diagnosis not present

## 2020-09-12 ENCOUNTER — Ambulatory Visit (INDEPENDENT_AMBULATORY_CARE_PROVIDER_SITE_OTHER): Payer: Medicare Other | Admitting: Family Medicine

## 2020-09-12 ENCOUNTER — Other Ambulatory Visit: Payer: Self-pay

## 2020-09-12 ENCOUNTER — Telehealth: Payer: Self-pay

## 2020-09-12 ENCOUNTER — Encounter: Payer: Self-pay | Admitting: Family Medicine

## 2020-09-12 VITALS — BP 124/80 | HR 76 | Temp 97.2°F | Wt 151.8 lb

## 2020-09-12 DIAGNOSIS — R300 Dysuria: Secondary | ICD-10-CM | POA: Diagnosis not present

## 2020-09-12 DIAGNOSIS — N3090 Cystitis, unspecified without hematuria: Secondary | ICD-10-CM | POA: Diagnosis not present

## 2020-09-12 LAB — POCT URINALYSIS DIP (CLINITEK)
Bilirubin, UA: NEGATIVE
Blood, UA: NEGATIVE
Glucose, UA: NEGATIVE mg/dL
Ketones, POC UA: NEGATIVE mg/dL
Nitrite, UA: NEGATIVE
POC PROTEIN,UA: NEGATIVE
Spec Grav, UA: <=1.005 — AB (ref 1.010–1.025)
Urobilinogen, UA: 0.2 E.U./dL
pH, UA: 6.5 (ref 5.0–8.0)

## 2020-09-12 MED ORDER — SULFAMETHOXAZOLE-TRIMETHOPRIM 800-160 MG PO TABS: 1.0000 | ORAL_TABLET | Freq: Two times a day (BID) | ORAL | 0 refills | Status: DC

## 2020-09-12 NOTE — Telephone Encounter (Signed)
[name], would you be able to help with rooming/collecting urine sample at 1:00?

## 2020-09-12 NOTE — Telephone Encounter (Signed)
Can she get added on at 1pm?  Do we have coverage with lab/rooming at that point?

## 2020-09-12 NOTE — Progress Notes (Signed)
This visit occurred during the SARS-CoV-2 public health emergency.  Safety protocols were in place, including screening questions prior to the visit, additional usage of staff PPE, and extensive cleaning of exam room while observing appropriate contact time as indicated for disinfecting solutions.  Dysuria: yes duration of symptoms: several weeks intermittently.  Had chills has night and about 2 weeks ago.  Prev had urinary urgency and frequency.  abdominal pain: not not but had some discomfort last night.   fevers:no back pain:not now but prev with some back pain.   Vomiting:no She has been taking plenty of fluids in thea meantime.    Meds, vitals, and allergies reviewed.   Per HPI unless specifically indicated in ROS section   GEN: nad, alert and oriented HEENT: ncat NECK: supple CV: rrr.  PULM: ctab, no inc wob ABD: soft, +bs, suprapubic area not tender EXT: no edema SKIN: well perfused.  She has some benign-appearing chronic skin changes just proximal to the ankle without signs of infection. BACK: no CVA pain

## 2020-09-12 NOTE — Telephone Encounter (Signed)
Per Dr. Para March, pt is to come now.  Pt verbalized understanding.  Placed pt on schedule. Placed order for urinalysis.

## 2020-09-12 NOTE — Telephone Encounter (Signed)
Patient contact the office and states she is having sx of a UTI. She states that she has typical burning, lower abdominal pressure, and foul odor. Unfortunately, there are no appt slots available today - is the patient able to come in for a lab appt today to drop off a urine sample?

## 2020-09-12 NOTE — Patient Instructions (Signed)
Drink plenty of water and start the antibiotics today.  We'll contact you with your lab report.  Take care.   

## 2020-09-15 ENCOUNTER — Other Ambulatory Visit: Payer: Self-pay | Admitting: Family Medicine

## 2020-09-15 LAB — URINE CULTURE
MICRO NUMBER:: 11049838
SPECIMEN QUALITY:: ADEQUATE

## 2020-09-15 MED ORDER — NITROFURANTOIN MONOHYD MACRO 100 MG PO CAPS: 100.0000 mg | ORAL_CAPSULE | Freq: Two times a day (BID) | ORAL | 0 refills | Status: DC

## 2020-09-17 NOTE — Assessment & Plan Note (Signed)
Likely, okay for outpatient follow-up.  See notes on urine culture.  Start Macrobid.  See orders.  Incidental chronic skin changes noted about the ankle without intervention needed.

## 2021-08-27 ENCOUNTER — Other Ambulatory Visit (HOSPITAL_BASED_OUTPATIENT_CLINIC_OR_DEPARTMENT_OTHER): Payer: Self-pay | Admitting: Orthopaedic Surgery

## 2021-08-27 DIAGNOSIS — M25561 Pain in right knee: Secondary | ICD-10-CM

## 2021-08-28 ENCOUNTER — Other Ambulatory Visit: Payer: Self-pay

## 2021-08-28 ENCOUNTER — Ambulatory Visit (HOSPITAL_BASED_OUTPATIENT_CLINIC_OR_DEPARTMENT_OTHER)
Admission: RE | Admit: 2021-08-28 | Discharge: 2021-08-28 | Disposition: A | Discharge status: Home / Self Care | Payer: Medicare Other | Source: Ambulatory Visit | Attending: Orthopaedic Surgery | Admitting: Orthopaedic Surgery

## 2021-08-28 ENCOUNTER — Ambulatory Visit (INDEPENDENT_AMBULATORY_CARE_PROVIDER_SITE_OTHER): Payer: Medicare Other | Admitting: Orthopaedic Surgery

## 2021-08-28 VITALS — Ht 66.5 in | Wt 141.0 lb

## 2021-08-28 DIAGNOSIS — M1711 Unilateral primary osteoarthritis, right knee: Secondary | ICD-10-CM

## 2021-08-28 DIAGNOSIS — M25561 Pain in right knee: Secondary | ICD-10-CM | POA: Diagnosis not present

## 2021-08-28 MED ORDER — LIDOCAINE HCL 1 % IJ SOLN
4.0000 mL | INTRAMUSCULAR | Status: AC | PRN
  Administered 2021-08-28: 4 mL

## 2021-08-28 MED ORDER — TRIAMCINOLONE ACETONIDE 40 MG/ML IJ SUSP
80.0000 mg | INTRAMUSCULAR | Status: AC | PRN
  Administered 2021-08-28: 80 mg via INTRA_ARTICULAR

## 2021-08-28 NOTE — Progress Notes (Signed)
Chief Complaint: Right knee pain     History of Present Illness:   Pain Score: 7/10 SANE: 65/100  [name] is a 75 y.o. female with right knee pain going on now for over a year.  Charlie specific injury or trauma.  She states that she has pain even while at rest or with a prolonged period of sitting.  She has been taking anti-inflammatories for it which helps somewhat.  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  He does cause occasional swelling and difficulty putting weight on the knee.  She does have a history of Sjogren's disease.  She states that she has had an injection in the right knee approximately 15 years ago which gave her prolonged relief.    Surgical History:   None  PMH/PSH/Family History/Social History/Meds/Allergies:    Past Medical History:  Diagnosis Date  . Anemia    hx  . Arthritis   . History of DVT (deep vein thrombosis)    on hormones   . Left inguinal hernia October 2012  . Lupus anticoagulant positive    Past Surgical History:  Procedure Laterality Date  . FOOT SURGERY  09/21/2005, 2009, 2011   both feet  . INGUINAL HERNIA REPAIR  11/05/2011   Procedure: HERNIA REPAIR INGUINAL ADULT;  Surgeon: Wilmon Arms. Corliss Skains, MD;  Location: [facility] OR;  Service: General;  Laterality: Left;  LEFT INGUINAL HERNIA REPAIR WITH MESH  . TUBAL LIGATION     81   Social History   Socioeconomic History  . Marital status: Married    Spouse name: Not on file  . Number of children: Not on file  . Years of education: Not on file  . Highest education level: Not on file  Occupational History  . Not on file  Tobacco Use  . Smoking status: Never  . Smokeless tobacco: Never  Substance and Sexual Activity  . Alcohol use: Yes    Alcohol/week: 3.0 - 4.0 standard drinks    Types: 3 - 4 Glasses of wine per week    Comment: 4 glasses of wine weekly  . Drug use: No  .  Sexual activity: Yes    Birth control/protection: Post-menopausal  Other Topics Concern  . Not on file  Social History Narrative   Nonsmoker     [place]ives in [place]   Married, 1973   3 kids, local   Retired from [employer] 2016, Environmental manager   Social Determinants of Health   Financial Resource Strain: Not on [employer] Insecurity: Not on file  Transportation Needs: Not on file  Physical Activity: Not on file  Stress: Not on file  Social Connections: Not on file   Family History  Problem Relation Age of Onset  . Heart disease Father   . Colon cancer Neg Hx   . Breast cancer Neg Hx    Allergies  Allergen Reactions  . Amoxicillin-Pot Clavulanate     Leg cramps  . Ciprofloxacin     Leg cramps  . Hydroxychloroquine Sulfate     Facial hives   Current Outpatient Medications  Medication Sig Dispense Refill  . aspirin EC 81 MG tablet Take 81 mg by mouth daily.      Marland Kitchen  calcium carbonate (OS-CAL) 600 MG TABS Take 600 mg by mouth 2 (two) times daily with a meal.      . magnesium oxide (MAG-OX) 400 MG tablet Take 400 mg by mouth daily.    . Multiple Vitamin (MULTIVITAMIN) tablet Take 1 tablet by mouth daily.      . nitrofurantoin, macrocrystal-monohydrate, (MACROBID) 100 MG capsule Take 1 capsule (100 mg total) by mouth 2 (two) times daily. 14 capsule 0   No current facility-administered medications for this visit.   Facility-Administered Medications Ordered in Other Visits  Medication Dose Route Frequency Provider Last Rate Last Admin  . ceFAZolin (ANCEF) IVPB 2 g/50 mL premix  2 g Intravenous 60 min Pre-Op Manus Rudd, MD       No results found.  Review of Systems:   A ROS was performed including pertinent positives and negatives as documented in the HPI.  Physical Exam :   Constitutional: NAD and appears stated age Neurological: Alert and oriented Psych: Appropriate affect and cooperative Height 5' 6.5" (1.689 m), weight 141 lb (64 kg).    Comprehensive Musculoskeletal Exam:     Musculoskeletal Exam  Gait Antalgic  Alignment Normal   Right Left  Inspection Normal Normal  Palpation    Tenderness Tricompartmental Tricompartmental  Crepitus Positive Positive  Effusion Trace Trace  Range of Motion    Extension 0 0  Flexion 130 130  Strength    Extension 5/5 5/5  Flexion 5/5 5/5  Ligament Exam     Generalized Laxity No No  Lachman Negative Negative   Pivot Shift Negative Negative  Anterior Drawer Negative Negative  Valgus at 0 Negative Negative  Valgus at 20 Negative Negative  Varus at 0 0 0  Varus at 20   0 0  Posterior Drawer at 90 0 0  Vascular/Lymphatic Exam    Edema None None  Venous Stasis Changes No No  Distal Circulation Normal Normal  Neurologic    Light Touch Sensation Intact Intact  Special Tests:      Imaging:   Xray (4 views right knee): Mild tricompartmental osteoarthritis    I personally reviewed and interpreted the radiographs.   Assessment:   75 year old female with mild tricompartmental osteoarthritis.  This is probably involving the lateral tibiofemoral joint space.  She does have a history of having an injection which gave her prolonged relief 15 years prior.  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  Plan :    -Right ultrasound-guided knee injection performed today -She will follow-up as needed should symptoms fail to improve    Procedure Note  Patient: [name]             Date of Birth: [birth year]           MRN: [id number]             Visit Date: 08/28/2021  Procedures: Visit Diagnoses:  1. Primary osteoarthritis of right knee     Large Joint Inj: R knee on 08/28/2021 10:19 AM Indications: pain Details: 22 G 1.5 in needle, ultrasound-guided anterior approach  Arthrogram: No  Medications: 4 mL lidocaine 1 %; 80 mg  triamcinolone acetonide 40 MG/ML Outcome: tolerated well, no immediate complications Procedure, treatment alternatives, risks and benefits explained, specific risks discussed. Consent was given by the patient. Immediately prior to procedure a time out was called to  verify the correct patient, procedure, equipment, support staff and site/side marked as required. Patient was prepped and draped in the usual sterile fashion.        I personally saw and evaluated the patient, and participated in the management and treatment plan.  Huel Cote, MD Attending Physician, Orthopedic Surgery  This document was dictated using Dragon voice recognition software. A reasonable attempt at proof reading has been made to minimize errors.

## 2022-05-19 ENCOUNTER — Ambulatory Visit (INDEPENDENT_AMBULATORY_CARE_PROVIDER_SITE_OTHER): Payer: Medicare Other

## 2022-05-19 ENCOUNTER — Ambulatory Visit (INDEPENDENT_AMBULATORY_CARE_PROVIDER_SITE_OTHER): Payer: Medicare Other | Admitting: Orthopaedic Surgery

## 2022-05-19 DIAGNOSIS — M79672 Pain in left foot: Secondary | ICD-10-CM

## 2022-05-19 DIAGNOSIS — S92355A Nondisplaced fracture of fifth metatarsal bone, left foot, initial encounter for closed fracture: Secondary | ICD-10-CM | POA: Diagnosis not present

## 2022-05-19 NOTE — Progress Notes (Signed)
Chief Complaint: Left foot pain     History of Present Illness:    [name] is a 76 y.o. female presents today with left foot pain after he had a.  Since that time she has pain about the lateral aspect of the foot.  Overall she is continuing to feel better.  She has been working on this as tolerated.  She has noticed some swelling in the foot.    Surgical History:   None  PMH/PSH/Family History/Social History/Meds/Allergies:    Past Medical History:  Diagnosis Date   Anemia    hx   Arthritis    History of DVT (deep vein thrombosis)    on hormones    Left inguinal hernia October 2012   Lupus anticoagulant positive    Past Surgical History:  Procedure Laterality Date   FOOT SURGERY  09/21/2005, 2009, 2011   both feet   INGUINAL HERNIA REPAIR  11/05/2011   Procedure: HERNIA REPAIR INGUINAL ADULT;  Surgeon: Wilmon Arms. Corliss Skains, MD;  Location: [facility] OR;  Service: General;  Laterality: Left;  LEFT INGUINAL HERNIA REPAIR WITH MESH   TUBAL LIGATION     81   Social History   Socioeconomic History   Marital status: Married    Spouse name: Not on file   Number of children: Not on file   Years of education: Not on file   Highest education level: Not on file  Occupational History   Not on file  Tobacco Use   Smoking status: Never   Smokeless tobacco: Never  Substance and Sexual Activity   Alcohol use: Yes    Alcohol/week: 3.0 - 4.0 standard drinks of alcohol    Types: 3 - 4 Glasses of wine per week    Comment: 4 glasses of wine weekly   Drug use: No   Sexual activity: Yes    Birth control/protection: Post-menopausal  Other Topics Concern   Not on file  Social History Narrative   Nonsmoker     Lives in [place]   Married, 1973   3 kids, local   Retired from [employer] 2016, Environmental manager   Social Determinants of Corporate investment banker Strain: Not on file  Food Insecurity: Not on file   Transportation Needs: Not on file  Physical Activity: Not on file  Stress: Not on file  Social Connections: Not on file   Family History  Problem Relation Age of Onset   Heart disease Father    Colon cancer Neg Hx    Breast cancer Neg Hx    Allergies  Allergen Reactions   Amoxicillin-Pot Clavulanate     Leg cramps   Ciprofloxacin     Leg cramps   Hydroxychloroquine Sulfate     Facial hives   Current Outpatient Medications  Medication Sig Dispense Refill   aspirin EC 81 MG tablet Take 81 mg by mouth daily.       calcium carbonate (OS-CAL) 600 MG TABS Take 600 mg by mouth 2 (two) times daily with a meal.       magnesium oxide (MAG-OX) 400 MG tablet Take 400 mg by mouth daily.     Multiple Vitamin (MULTIVITAMIN) tablet Take 1 tablet by mouth daily.       nitrofurantoin, macrocrystal-monohydrate, (MACROBID) 100 MG capsule  Take 1 capsule (100 mg total) by mouth 2 (two) times daily. 14 capsule 0   No current facility-administered medications for this visit.   Facility-Administered Medications Ordered in Other Visits  Medication Dose Route Frequency Provider Last Rate Last Admin   ceFAZolin (ANCEF) IVPB 2 g/50 mL premix  2 g Intravenous 60 min Pre-Op Manus Rudd, MD       No results found.  Review of Systems:   A ROS was performed including pertinent positives and negatives as documented in the HPI.  Physical Exam :   Constitutional: NAD and appears stated age Neurological: Alert and oriented Psych: Appropriate affect and cooperative There were no vitals taken for this visit.   Comprehensive Musculoskeletal Exam:    Tenderness palpation about the base of the left fifth metatarsal.  There is no bruising or deformity.  She is able to walk with a nonantalgic gait.  She does walk with significant positive sagittal balance  Imaging:   Xray (3 views left foot): Nondisplaced base of fifth metatarsal fracture  I personally reviewed and interpreted the  radiographs.   Assessment:   76 y.o. female with a left nondisplaced metatarsal base fracture.  Overall I do believe this would do quite well with nonoperative management.  At this time we will plan for weightbearing as tolerated and I will plan to see her back in 4 weeks with repeat x-rays.  We will also plan for neurosurgical referral at this time for discussion of scoliosis correction given the fact that she does have a very significant scoliosis deformity with a positive sagittal balance that makes it very difficult for her to walk.  Plan :    -Return to clinic in 4 weeks for assessment     I personally saw and evaluated the patient, and participated in the management and treatment plan.  Huel Cote, MD Attending Physician, Orthopedic Surgery  This document was dictated using Dragon voice recognition software. A reasonable attempt at proof reading has been made to minimize errors.

## 2022-06-07 DIAGNOSIS — M5416 Radiculopathy, lumbar region: Secondary | ICD-10-CM | POA: Diagnosis not present

## 2022-06-07 DIAGNOSIS — M4125 Other idiopathic scoliosis, thoracolumbar region: Secondary | ICD-10-CM | POA: Diagnosis not present

## 2022-06-14 ENCOUNTER — Ambulatory Visit (INDEPENDENT_AMBULATORY_CARE_PROVIDER_SITE_OTHER): Payer: Medicare Other

## 2022-06-14 ENCOUNTER — Ambulatory Visit (INDEPENDENT_AMBULATORY_CARE_PROVIDER_SITE_OTHER): Payer: Medicare Other | Admitting: Orthopaedic Surgery

## 2022-06-14 DIAGNOSIS — S92352A Displaced fracture of fifth metatarsal bone, left foot, initial encounter for closed fracture: Secondary | ICD-10-CM | POA: Diagnosis not present

## 2022-06-14 DIAGNOSIS — S92355A Nondisplaced fracture of fifth metatarsal bone, left foot, initial encounter for closed fracture: Secondary | ICD-10-CM

## 2022-06-14 NOTE — Progress Notes (Signed)
Chief Complaint: Left foot pain     History of Present Illness:   06/14/2022: Presents today for follow-up of her base of fifth metatarsal fracture.  At this time she is back into a normal shoe.  She has no pain.  She overall feels much better.  [name] is a 76 y.o. female presents today with left foot pain after he had a.  Since that time she has pain about the lateral aspect of the foot.  Overall she is continuing to feel better.  She has been working on this as tolerated.  She has noticed some swelling in the foot.    Surgical History:   None  PMH/PSH/Family History/Social History/Meds/Allergies:    Past Medical History:  Diagnosis Date   Anemia    hx   Arthritis    History of DVT (deep vein thrombosis)    on hormones    Left inguinal hernia October 2012   Lupus anticoagulant positive    Past Surgical History:  Procedure Laterality Date   FOOT SURGERY  09/21/2005, 2009, 2011   both feet   INGUINAL HERNIA REPAIR  11/05/2011   Procedure: HERNIA REPAIR INGUINAL ADULT;  Surgeon: Wilmon Arms. Corliss Skains, MD;  Location: [facility];  Service: General;  Laterality: Left;  LEFT INGUINAL HERNIA REPAIR WITH MESH   TUBAL LIGATION     81   Social History   Socioeconomic History   Marital status: Married    Spouse name: Not on file   Number of children: Not on file   Years of education: Not on file   Highest education level: Not on file  Occupational History   Not on file  Tobacco Use   Smoking status: Never   Smokeless tobacco: Never  Substance and Sexual Activity   Alcohol use: Yes    Alcohol/week: 3.0 - 4.0 standard drinks of alcohol    Types: 3 - 4 Glasses of wine per week    Comment: 4 glasses of wine weekly   Drug use: No   Sexual activity: Yes    Birth control/protection: Post-menopausal  Other Topics Concern   Not on file  Social History Narrative   Nonsmoker     [place] in [place]   Married, 1973   3 kids, local    Retired from [employer] 2016, Environmental manager   Social Determinants of Corporate investment banker Strain: Not on file  Food Insecurity: Not on file  Transportation Needs: Not on file  Physical Activity: Not on file  Stress: Not on file  Social Connections: Not on file   Family History  Problem Relation Age of Onset   Heart disease Father    Colon cancer Neg Hx    Breast cancer Neg Hx    Allergies  Allergen Reactions   Amoxicillin-Pot Clavulanate     Leg cramps   Ciprofloxacin     Leg cramps   Hydroxychloroquine Sulfate     Facial hives   Current Outpatient Medications  Medication Sig Dispense Refill   aspirin EC 81 MG tablet Take 81 mg by mouth daily.       calcium carbonate (OS-CAL) 600 MG TABS Take 600 mg by mouth 2 (two) times daily with a meal.       magnesium oxide (MAG-OX) 400  MG tablet Take 400 mg by mouth daily.     Multiple Vitamin (MULTIVITAMIN) tablet Take 1 tablet by mouth daily.       nitrofurantoin, macrocrystal-monohydrate, (MACROBID) 100 MG capsule Take 1 capsule (100 mg total) by mouth 2 (two) times daily. 14 capsule 0   No current facility-administered medications for this visit.   Facility-Administered Medications Ordered in Other Visits  Medication Dose Route Frequency Provider Last Rate Last Admin   ceFAZolin (ANCEF) IVPB 2 g/50 mL premix  2 g Intravenous 60 min Pre-Op Manus Rudd, MD       No results found.  Review of Systems:   A ROS was performed including pertinent positives and negatives as documented in the HPI.  Physical Exam :   Constitutional: NAD and appears stated age Neurological: Alert and oriented Psych: Appropriate affect and cooperative There were no vitals taken for this visit.   Comprehensive Musculoskeletal Exam:    No tenderness palpation about the base of the left fifth metatarsal.  There is no bruising or deformity.  She is able to walk with a nonantalgic gait.  She does walk with significant  positive sagittal balance  Imaging:   Xray (3 views left foot): Nondisplaced base of fifth metatarsal fracture with significant interval callus formation  I personally reviewed and interpreted the radiographs.   Assessment:   76 y.o. female with a left nondisplaced metatarsal base fracture.  She is doing very well at today's visit.  She will be activity as tolerated.  I will see her as needed  Plan :    -Return to clinic as needed     I personally saw and evaluated the patient, and participated in the management and treatment plan.  Kayla Cote, MD Attending Physician, Orthopedic Surgery  This document was dictated using Dragon voice recognition software. A reasonable attempt at proof reading has been made to minimize errors.

## 2022-06-16 DIAGNOSIS — M5416 Radiculopathy, lumbar region: Secondary | ICD-10-CM | POA: Diagnosis not present

## 2022-06-16 DIAGNOSIS — M4126 Other idiopathic scoliosis, lumbar region: Secondary | ICD-10-CM | POA: Diagnosis not present

## 2022-06-16 DIAGNOSIS — M545 Low back pain, unspecified: Secondary | ICD-10-CM | POA: Diagnosis not present

## 2022-06-16 DIAGNOSIS — M47816 Spondylosis without myelopathy or radiculopathy, lumbar region: Secondary | ICD-10-CM | POA: Diagnosis not present

## 2022-06-16 DIAGNOSIS — M5136 Other intervertebral disc degeneration, lumbar region: Secondary | ICD-10-CM | POA: Diagnosis not present

## 2022-06-17 DIAGNOSIS — M5451 Vertebrogenic low back pain: Secondary | ICD-10-CM | POA: Diagnosis not present

## 2022-06-17 DIAGNOSIS — M546 Pain in thoracic spine: Secondary | ICD-10-CM | POA: Diagnosis not present

## 2022-06-17 DIAGNOSIS — M6281 Muscle weakness (generalized): Secondary | ICD-10-CM | POA: Diagnosis not present

## 2022-06-25 DIAGNOSIS — M6281 Muscle weakness (generalized): Secondary | ICD-10-CM | POA: Diagnosis not present

## 2022-06-25 DIAGNOSIS — M5451 Vertebrogenic low back pain: Secondary | ICD-10-CM | POA: Diagnosis not present

## 2022-06-25 DIAGNOSIS — M546 Pain in thoracic spine: Secondary | ICD-10-CM | POA: Diagnosis not present

## 2022-06-28 DIAGNOSIS — M546 Pain in thoracic spine: Secondary | ICD-10-CM | POA: Diagnosis not present

## 2022-06-28 DIAGNOSIS — M6281 Muscle weakness (generalized): Secondary | ICD-10-CM | POA: Diagnosis not present

## 2022-06-28 DIAGNOSIS — M5451 Vertebrogenic low back pain: Secondary | ICD-10-CM | POA: Diagnosis not present

## 2022-06-30 DIAGNOSIS — M5451 Vertebrogenic low back pain: Secondary | ICD-10-CM | POA: Diagnosis not present

## 2022-06-30 DIAGNOSIS — M6281 Muscle weakness (generalized): Secondary | ICD-10-CM | POA: Diagnosis not present

## 2022-06-30 DIAGNOSIS — M546 Pain in thoracic spine: Secondary | ICD-10-CM | POA: Diagnosis not present

## 2022-07-07 DIAGNOSIS — M6281 Muscle weakness (generalized): Secondary | ICD-10-CM | POA: Diagnosis not present

## 2022-07-07 DIAGNOSIS — M5451 Vertebrogenic low back pain: Secondary | ICD-10-CM | POA: Diagnosis not present

## 2022-07-07 DIAGNOSIS — M546 Pain in thoracic spine: Secondary | ICD-10-CM | POA: Diagnosis not present

## 2022-07-09 DIAGNOSIS — M6281 Muscle weakness (generalized): Secondary | ICD-10-CM | POA: Diagnosis not present

## 2022-07-09 DIAGNOSIS — M546 Pain in thoracic spine: Secondary | ICD-10-CM | POA: Diagnosis not present

## 2022-07-09 DIAGNOSIS — M5451 Vertebrogenic low back pain: Secondary | ICD-10-CM | POA: Diagnosis not present

## 2022-07-14 DIAGNOSIS — M6281 Muscle weakness (generalized): Secondary | ICD-10-CM | POA: Diagnosis not present

## 2022-07-14 DIAGNOSIS — M546 Pain in thoracic spine: Secondary | ICD-10-CM | POA: Diagnosis not present

## 2022-07-14 DIAGNOSIS — M5451 Vertebrogenic low back pain: Secondary | ICD-10-CM | POA: Diagnosis not present

## 2022-07-16 DIAGNOSIS — M5451 Vertebrogenic low back pain: Secondary | ICD-10-CM | POA: Diagnosis not present

## 2022-07-16 DIAGNOSIS — M6281 Muscle weakness (generalized): Secondary | ICD-10-CM | POA: Diagnosis not present

## 2022-07-16 DIAGNOSIS — M546 Pain in thoracic spine: Secondary | ICD-10-CM | POA: Diagnosis not present

## 2022-08-11 DIAGNOSIS — M4125 Other idiopathic scoliosis, thoracolumbar region: Secondary | ICD-10-CM | POA: Diagnosis not present

## 2022-08-11 DIAGNOSIS — M5416 Radiculopathy, lumbar region: Secondary | ICD-10-CM | POA: Diagnosis not present

## 2022-09-29 ENCOUNTER — Ambulatory Visit (INDEPENDENT_AMBULATORY_CARE_PROVIDER_SITE_OTHER): Payer: Medicare Other | Admitting: Orthopaedic Surgery

## 2022-09-29 DIAGNOSIS — M4156 Other secondary scoliosis, lumbar region: Secondary | ICD-10-CM

## 2022-09-29 DIAGNOSIS — M1711 Unilateral primary osteoarthritis, right knee: Secondary | ICD-10-CM

## 2022-09-29 NOTE — Progress Notes (Signed)
Chief Complaint: Right knee pain     History of Present Illness:   09/29/2022: Presents today for follow-up of her right knee pain.  She states that she is having pain in this.  She got approximately 3 months relief from her previous injection.  She is here today seeking additional injection.  She continues to have pain with lifting of the leg.    Surgical History:   None  PMH/PSH/Family History/Social History/Meds/Allergies:    Past Medical History:  Diagnosis Date   Anemia    hx   Arthritis    History of DVT (deep vein thrombosis)    on hormones    Left inguinal hernia October 2012   Lupus anticoagulant positive    Past Surgical History:  Procedure Laterality Date   FOOT SURGERY  09/21/2005, 2009, 2011   both feet   INGUINAL HERNIA REPAIR  11/05/2011   Procedure: HERNIA REPAIR INGUINAL ADULT;  Surgeon: Imogene Burn. Georgette Dover, MD;  Location: [place];  Service: General;  Laterality: Left;  LEFT INGUINAL HERNIA REPAIR WITH MESH   TUBAL LIGATION     58   Social History   Socioeconomic History   Marital status: Married    Spouse name: Not on file   Number of children: Not on file   Years of education: Not on file   Highest education level: Not on file  Occupational History   Not on file  Tobacco Use   Smoking status: Never   Smokeless tobacco: Never  Substance and Sexual Activity   Alcohol use: Yes    Alcohol/week: 3.0 - 4.0 standard drinks of alcohol    Types: 3 - 4 Glasses of wine per week    Comment: 4 glasses of wine weekly   Drug use: No   Sexual activity: Yes    Birth control/protection: Post-menopausal  Other Topics Concern   Not on file  Social History Narrative   Nonsmoker     [place]ives in [place]   Married, 1973   3 kids, local   Retired from [employer] 2016, Soil scientist   Social Determinants of Radio broadcast assistant Strain: Not on file  Food Insecurity: Not on file  Transportation Needs:  Not on file  Physical Activity: Not on file  Stress: Not on file  Social Connections: Not on file   Family History  Problem Relation Age of Onset   Heart disease Father    Colon cancer Neg Hx    Breast cancer Neg Hx    Allergies  Allergen Reactions   Amoxicillin-Pot Clavulanate     Leg cramps   Ciprofloxacin     Leg cramps   Hydroxychloroquine Sulfate     Facial hives   Current Outpatient Medications  Medication Sig Dispense Refill   aspirin EC 81 MG tablet Take 81 mg by mouth daily.       calcium carbonate (OS-CAL) 600 MG TABS Take 600 mg by mouth 2 (two) times daily with a meal.       magnesium oxide (MAG-OX) 400 MG tablet Take 400 mg by mouth daily.     Multiple Vitamin (MULTIVITAMIN) tablet Take 1 tablet by mouth daily.       nitrofurantoin, macrocrystal-monohydrate, (MACROBID) 100 MG capsule Take 1 capsule (100 mg total) by mouth 2 (  two) times daily. 14 capsule 0   No current facility-administered medications for this visit.   Facility-Administered Medications Ordered in Other Visits  Medication Dose Route Frequency Provider Last Rate Last Admin   ceFAZolin (ANCEF) IVPB 2 g/50 mL premix  2 g Intravenous 60 min Pre-Op Manus Rudd, MD       No results found.  Review of Systems:   A ROS was performed including pertinent positives and negatives as documented in the HPI.  Physical Exam :   Constitutional: NAD and appears stated age Neurological: Alert and oriented Psych: Appropriate affect and cooperative There were no vitals taken for this visit.   Comprehensive Musculoskeletal Exam:   Tenderness about the lateral tibiofemoral joint.  There is some crepitus.  Range of motion is from 0 to 120 degrees.  Ligamentously stable knee.  Imaging:   Xray (3 views right knee): Mild to moderate osteoarthritis right knee  I personally reviewed and interpreted the radiographs.   Assessment:   76 y.o. female with right knee tricompartmental osteoarthritis.  At today's  visit she is hoping to get an additional injection in the right knee.  This was provided.  I discussed with her briefly knee arthroplasty as well she does have mild to moderate osteoarthritis involving the right knee.  She would like to defer this at this time.  I will plan to see her back for her knee should this wear off and at that time we will also consider hip x-rays as well. Plan :    -Return to clinic as needed     I personally saw and evaluated the patient, and participated in the management and treatment plan.  Huel Cote, MD Attending Physician, Orthopedic Surgery  This document was dictated using Dragon voice recognition software. A reasonable attempt at proof reading has been made to minimize errors.

## 2022-10-06 ENCOUNTER — Ambulatory Visit (INDEPENDENT_AMBULATORY_CARE_PROVIDER_SITE_OTHER): Payer: Medicare Other | Admitting: Orthopedic Surgery

## 2022-10-06 ENCOUNTER — Encounter: Payer: Self-pay | Admitting: Orthopedic Surgery

## 2022-10-06 VITALS — BP 182/95 | HR 69 | Ht 66.5 in | Wt 141.0 lb

## 2022-10-06 DIAGNOSIS — M4156 Other secondary scoliosis, lumbar region: Secondary | ICD-10-CM

## 2022-10-06 NOTE — Progress Notes (Signed)
Orthopedic Spine Surgery Office Note  Assessment: Patient is a 76 y.o. female with chronic, progressive low back pain and degenerative scoliosis   Plan: -Explained hat without imaging, I cannot say that she has scoliosis or sagittal plane deformity either.  However, we talked under the assumption that she did have a scoliosis and I explained that if her head is no longer centered over her pelvis in either the coronal or sagittal plane that there are surgical options to correct that.  However, I informed her that the major complication rate for deformity type surgery is around 30%.  With the minor complication rate factored in, it is even higher than 30% and can be as high as 50%.  She was not wanting to repeat imaging that has already been done which is reasonable.  She was going to obtain her prior imaging on a disc and come back and see me in 3 weeks time to discuss the imaging and potential neck steps in treatment -We did talk about the fact that she has some pain with internal and external rotation in her right hip so she may have hip arthritis that is causing some of her pain.  Hopefully her hips are captured on the imaging done at the outside institution.  Explained that if we think some of her symptoms are coming from the hip that an injection may be a reasonable diagnostic and therapeutic modality to try -Patient should return to office in 3 weeks, repeat x-rays of lumbar spine at next visit: None   Patient expressed understanding of the plan and all questions were answered to the patient's satisfaction.   ___________________________________________________________________________   History:  Patient is a 76 y.o. female who presents today for lumbar spine.  Patient has had several year history of low back pain.  It is gotten progressively worse with time.  She feels it in her lower lumbar spine.  She notices it is worse with activity.  She describes it will hurt if she holds her grandchild  or pulls the cart.  She periodically gets pain that radiates down her right leg.  She has been previously seen at [facility] and was told that she had degenerative scoliosis.  They discussed the morbidity with operative intervention and she came here for a second opinion.  There was no trauma or injury that brought on the pain.  Also reports right knee pain but injection to that area helped with that pain.  Weakness: Denies Symptoms of imbalance: Denies Paresthesias and numbness: Yes, down the posterior aspect of the right leg when that pain occurs.  No other numbness or paresthesias Bowel or bladder incontinence: Denies Saddle anesthesia: Denies  Treatments tried: Activity modification, physical therapy, NSAIDs  Review of systems: Denies fevers and chills, night sweats, unexplained weight loss, history of cancer, pain that wakes them at night  Past medical history: History of DVT Osteoporosis Sjogren's  Allergies: Amoxicillin, ciprofloxacin, hydroxychloroquine  Past surgical history:  None  Social history: Denies use of nicotine product (smoking, vaping, patches, smokeless) Alcohol use: Rare Denies recreational drug use   Physical Exam:  General: no acute distress, appears stated age Neurologic: alert, answering questions appropriately, following commands Respiratory: unlabored breathing on room air, symmetric chest rise Psychiatric: appropriate affect, normal cadence to speech   MSK (spine):  -Strength exam      Left  Right EHL    5/5  5/5 TA    5/5  5/5 GSC    5/5  5/5 Knee extension  5/5  5/5 Hip flexion   5/5  5/5  -Sensory exam    Sensation intact to light touch in L3-S1 nerve distributions of bilateral lower extremities  -Achilles DTR: 1/4 on the left, 1/4 on the right -Patellar tendon DTR: 1/4 on the left, 1/4 on the right  -Straight leg raise: Negative -Contralateral straight leg raise: Negative -Clonus: no beats bilaterally -Negative hoffman  bilaterally -No imbalance with gait or tandem gait  -On inspection, appears to have positive sagittal balance  -Left hip exam: No pain through range of motion, negative Stinchfield -Right hip exam: Pain with extremes of internal and external rotation, positive Stinchfield, no pain through mid range of motion, negative Faber  Imaging: None obtained today   Patient name: [name] Patient MRN: [id number] Date of visit: 10/06/22

## 2022-11-03 ENCOUNTER — Ambulatory Visit: Payer: Self-pay

## 2022-11-03 ENCOUNTER — Ambulatory Visit (INDEPENDENT_AMBULATORY_CARE_PROVIDER_SITE_OTHER): Payer: Medicare Other | Admitting: Orthopedic Surgery

## 2022-11-03 DIAGNOSIS — M419 Scoliosis, unspecified: Secondary | ICD-10-CM

## 2022-11-03 NOTE — Progress Notes (Signed)
Orthopedic Spine Surgery Office Note  Assessment: Patient is a 76 y.o. female with low back pain and periodic right leg radicular pain, but the main issue is the low back pain. Has lumbar scoliosis and SVA >5cm, PI-LL mismatch, and a lateral listhesis that could be the etiology of her pain.    Plan: -Explained that initially conservative treatment is tried as a significant number of patients may experience relief with these treatment modalities. Discussed that the conservative treatments include:  -activity modification  -physical therapy  -over the counter pain medications  -pain management  -core strengthening -She does have a deformity and surgery could be performed to correct the deformity. I explained the risks of a major complication are as high as 30% again. Her pain is not severe and she is only using 650mg  tylenol daily so I recommended holding off on surgery. I told her to work on core strengthening. She has already done PT so she knows the exercises. I told her to do them 3-4/week every week. She can also increase her tylenol to 650mg  three times per day. I also told her it is okay to take NSAIDs with the tylenol for additional pain relief.  -If she is not getting adequate relief with this regimen, we can talk further about surgery or pain management -Patient should return to office on an as needed basis   Patient expressed understanding of the plan and all questions were answered to the patient's satisfaction.   ___________________________________________________________________________  History: Patient is a 76 y.o. female who has been previously seen in the office for symptoms consistent with lumbar radiculopathy. Since the last visit, symptom intensity has remained the same. Pain is worse at the end of the day and with activity. She also notes it if she is lifting something out in front of her. It gets better if she lays down or rests. Has some cramping type pain around her ankles  that is activity related. Says there is sometimes pain that radiates down the right leg (posterior aspect). No other radicular type pain.   Previous treatments: Activity modification, physical therapy, NSAIDs   COPY OF FIRST NOTE Patient is a 76 y.o. female who presents today for lumbar spine.  Patient has had several year history of low back pain.  It is gotten progressively worse with time.  She feels it in her lower lumbar spine.  She notices it is worse with activity.  She describes it will hurt if she holds her grandchild or pulls the cart.  She periodically gets pain that radiates down her right leg.  She has been previously seen at [facility] and was told that she had degenerative scoliosis.  They discussed the morbidity with operative intervention and she came here for a second opinion.  There was no trauma or injury that brought on the pain.   Also reports right knee pain but injection to that area helped with that pain.   Weakness: Denies Symptoms of imbalance: Denies Paresthesias and numbness: Yes, down the posterior aspect of the right leg when that pain occurs.  No other numbness or paresthesias Bowel or bladder incontinence: Denies Saddle anesthesia: Denies END OF COPY  Physical Exam:  General: no acute distress, appears stated age Neurologic: alert, answering questions appropriately, following commands Respiratory: unlabored breathing on room air, symmetric chest rise Psychiatric: appropriate affect, normal cadence to speech   MSK (spine):  -Strength exam      Left  Right EHL    5/5  5/5 TA  5/5  5/5 GSC    5/5  5/5 Knee extension  5/5  5/5 Hip flexion   5/5  5/5  -Sensory exam    Sensation intact to light touch in L3-S1 nerve distributions of bilateral lower extremities  -Biceps DTR: 2/4 bilaterally -Brachioradialis DTR: 2/4 bilaterally -Achilles DTR: 2/4 on the left, 2/4 on the right -Patellar tendon DTR: 1/4 on the left, 1/4 on the right  -Negative hoffman  bilaterally -Negative grip and release test -Straight leg raise: negative  -Contralateral straight leg raise: negative -Clonus: no beats bilaterally  Imaging: Scoliosis x-rays from 06/07/2022 was brought on a disc today and independently reviewed showing lumbar scoliosis with concavity to the left.  Positive sagittal balance measuring approximately 7 cm. Approximately 2cm of coronal imbalance form the CSVL. Lateral listhesis L3/4. LL of 36. PI of 62.  MRI of the lumbar spine from 06/16/2022 was independently reviewed and interpreted, showing multi-level central and lateral recess stenosis particularly in the cranial lumbar levels. There is multi-level foraminal stenosis as well.    Patient name: [name] Patient MRN: [id number] Date of visit: 11/03/22

## 2023-07-21 ENCOUNTER — Encounter (INDEPENDENT_AMBULATORY_CARE_PROVIDER_SITE_OTHER): Payer: Self-pay

## 2023-07-26 ENCOUNTER — Encounter: Payer: BC Managed Care – PPO | Admitting: Family Medicine

## 2023-08-01 IMAGING — DX DG FOOT COMPLETE 3+V*L*
3 series · 3 of 3 positions shown · non-contrast
Comparison: None available

CLINICAL DATA: Hit lateral side of left foot on shower wall.
Lateral left foot pain.

EXAM:
LEFT FOOT - COMPLETE 3+ VIEW

[foot ap]
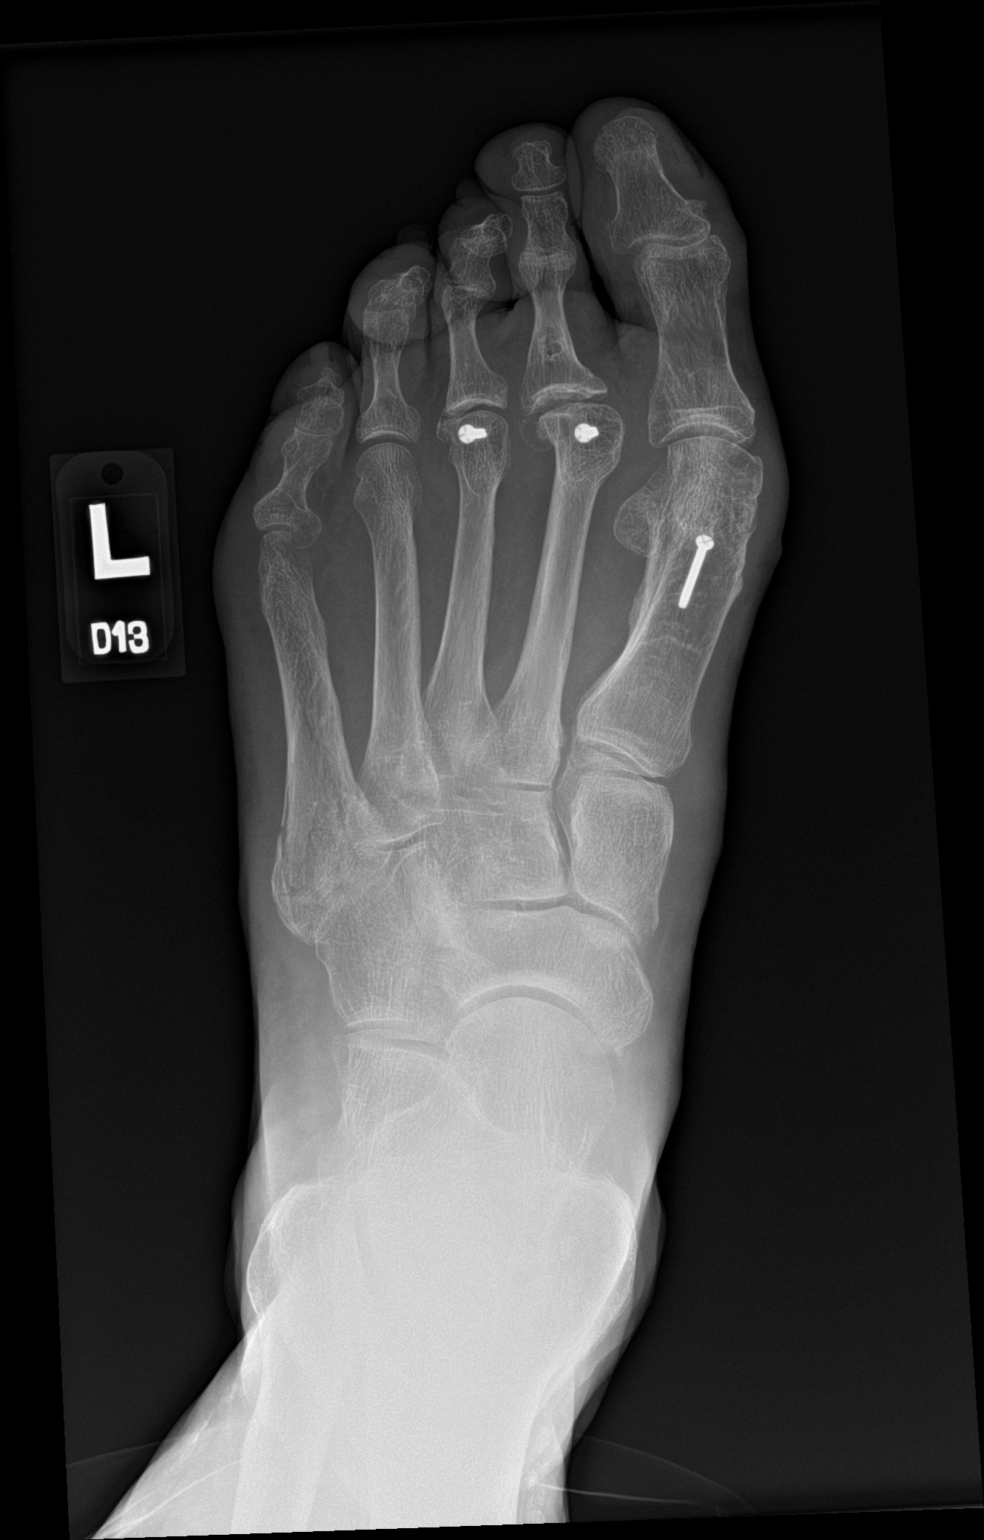

[foot obl]
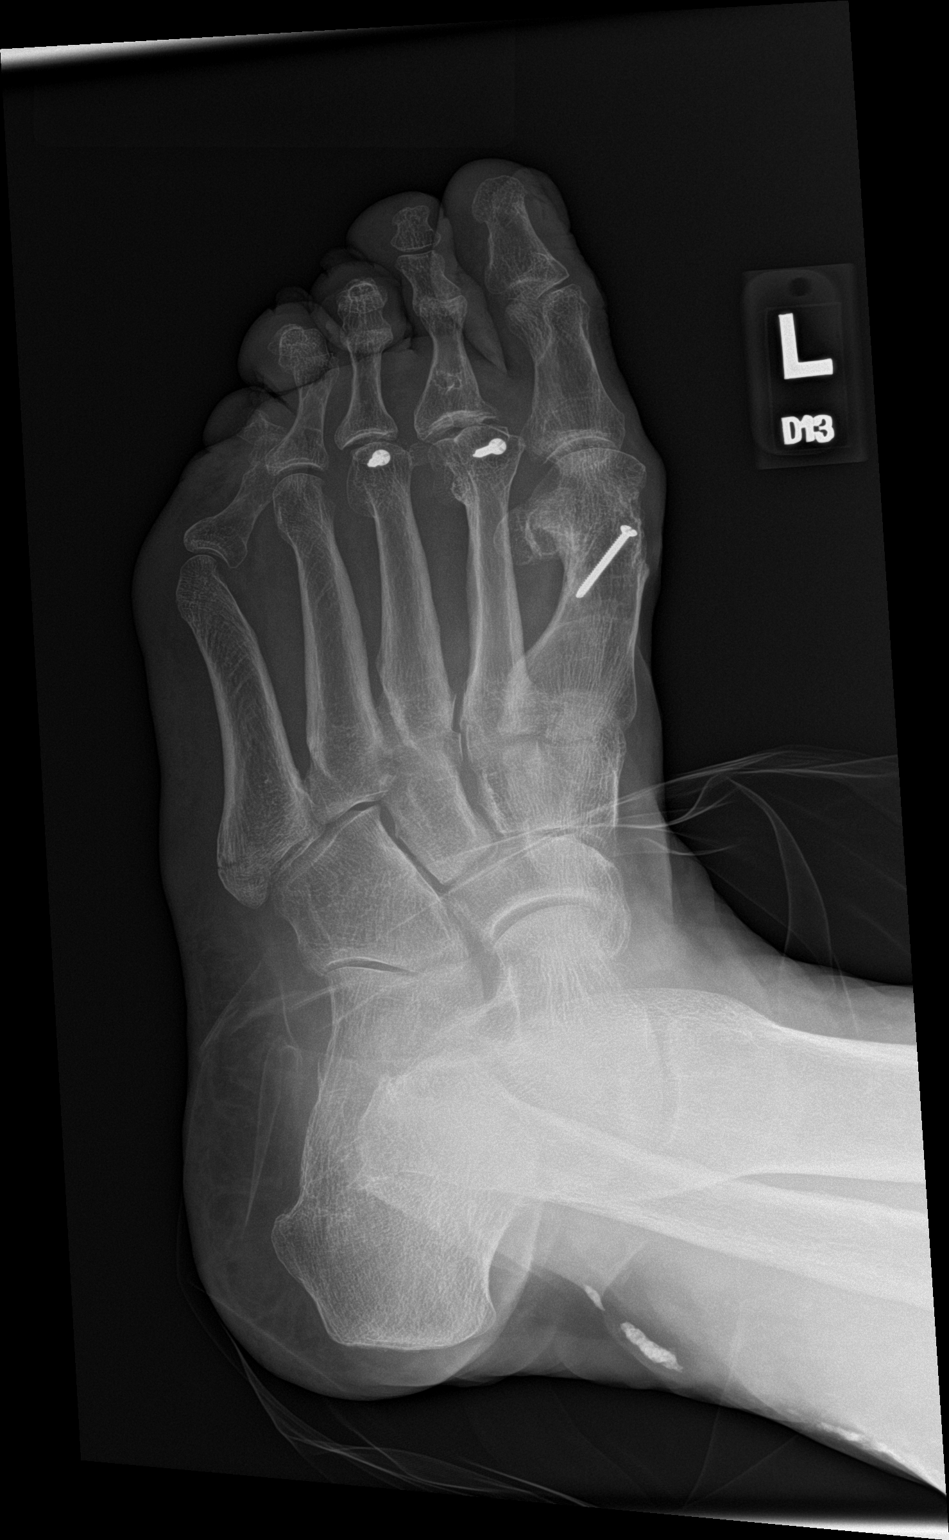

[foot lat]
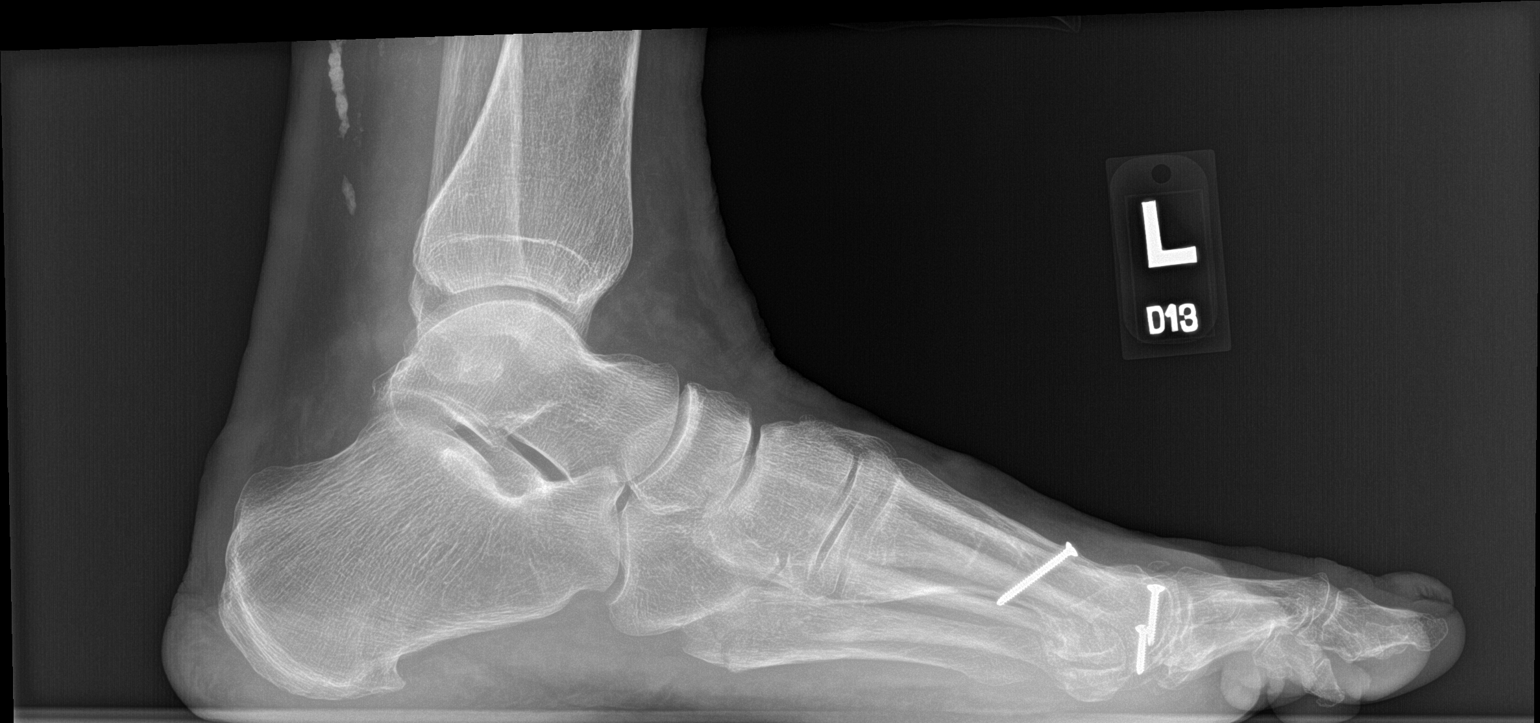

[3 of 3 positions shown; findings below may reference images not displayed]

FINDINGS: Postsurgical changes are seen of distal great toe metatarsal shaft
osteotomy with partial distal medial resection (bunionectomy).
Minimal hallux valgus. Mild-to-moderate joint space narrowing of the
interphalangeal joints and great toe metatarsophalangeal joint
diffusely. Screws overlie the second and third metatarsal heads
respectively from prior osteotomy or fixation. An old screw tract is
seen within the proximal shaft of the proximal phalanx of the second
toe.

There are multiple curvilinear lucencies indicating a nondisplaced
acute fracture of the base of fifth metatarsal, involving up to
approximately the proximal 3 cm of the lateral bone seen on frontal
view.

Small plantar calcaneal heel spur.
IMPRESSION: 1. Acute nondisplaced fracture of the proximal fifth metatarsal with
proximal intra-articular extension.
2. Postsurgical changes of the first through third metatarsals.

## 2023-08-21 ENCOUNTER — Other Ambulatory Visit: Payer: Self-pay | Admitting: Family Medicine

## 2023-08-21 DIAGNOSIS — E785 Hyperlipidemia, unspecified: Secondary | ICD-10-CM

## 2023-08-21 DIAGNOSIS — M8580 Other specified disorders of bone density and structure, unspecified site: Secondary | ICD-10-CM

## 2023-08-22 ENCOUNTER — Other Ambulatory Visit (INDEPENDENT_AMBULATORY_CARE_PROVIDER_SITE_OTHER): Payer: Medicare Other

## 2023-08-22 DIAGNOSIS — E785 Hyperlipidemia, unspecified: Secondary | ICD-10-CM | POA: Diagnosis not present

## 2023-08-22 DIAGNOSIS — M8580 Other specified disorders of bone density and structure, unspecified site: Secondary | ICD-10-CM | POA: Diagnosis not present

## 2023-08-22 LAB — COMPREHENSIVE METABOLIC PANEL
ALT: 15 U/L (ref 0–35)
AST: 17 U/L (ref 0–37)
Albumin: 4 g/dL (ref 3.5–5.2)
Alkaline Phosphatase: 58 U/L (ref 39–117)
BUN: 24 mg/dL — ABNORMAL HIGH (ref 6–23)
CO2: 30 meq/L (ref 19–32)
Calcium: 9.5 mg/dL (ref 8.4–10.5)
Chloride: 106 meq/L (ref 96–112)
Creatinine, Ser: 0.88 mg/dL (ref 0.40–1.20)
GFR: 63.23 mL/min (ref >60.00)
Glucose, Bld: 85 mg/dL (ref 70–99)
Potassium: 3.9 meq/L (ref 3.5–5.1)
Sodium: 143 meq/L (ref 135–145)
Total Bilirubin: 0.6 mg/dL (ref 0.2–1.2)
Total Protein: 6.7 g/dL (ref 6.0–8.3)

## 2023-08-22 LAB — CBC WITH DIFFERENTIAL/PLATELET
Basophils Absolute: 0 10*3/uL (ref 0.0–0.1)
Basophils Relative: 1 % (ref 0.0–3.0)
Eosinophils Absolute: 0.2 10*3/uL (ref 0.0–0.7)
Eosinophils Relative: 5.9 % — ABNORMAL HIGH (ref 0.0–5.0)
HCT: 36.3 % (ref 36.0–46.0)
Hemoglobin: 11.6 g/dL — ABNORMAL LOW (ref 12.0–15.0)
Lymphocytes Relative: 26.5 % (ref 12.0–46.0)
Lymphs Abs: 1 10*3/uL (ref 0.7–4.0)
MCHC: 32.1 g/dL (ref 30.0–36.0)
MCV: 96.8 fl (ref 78.0–100.0)
Monocytes Absolute: 0.6 10*3/uL (ref 0.1–1.0)
Monocytes Relative: 15 % — ABNORMAL HIGH (ref 3.0–12.0)
Neutro Abs: 2 10*3/uL (ref 1.4–7.7)
Neutrophils Relative %: 51.6 % (ref 43.0–77.0)
Platelets: 206 10*3/uL (ref 150.0–400.0)
RBC: 3.75 Mil/uL — ABNORMAL LOW (ref 3.87–5.11)
RDW: 13.6 % (ref 11.5–15.5)
WBC: 4 10*3/uL (ref 4.0–10.5)

## 2023-08-22 LAB — LIPID PANEL
Cholesterol: 179 mg/dL (ref 0–200)
HDL: 77.9 mg/dL (ref >39.00)
LDL Cholesterol: 83 mg/dL (ref 0–99)
NonHDL: 100.83
Total CHOL/HDL Ratio: 2
Triglycerides: 88 mg/dL (ref 0.0–149.0)
VLDL: 17.6 mg/dL (ref 0.0–40.0)

## 2023-08-22 LAB — VITAMIN D 25 HYDROXY (VIT D DEFICIENCY, FRACTURES): VITD: 62.06 ng/mL (ref 30.00–100.00)

## 2023-08-26 ENCOUNTER — Encounter: Payer: Self-pay | Admitting: Family Medicine

## 2023-08-26 ENCOUNTER — Ambulatory Visit: Payer: Medicare Other | Admitting: Family Medicine

## 2023-08-26 VITALS — BP 110/84 | HR 80 | Temp 98.0°F | Ht 66.5 in | Wt 146.0 lb

## 2023-08-26 DIAGNOSIS — R21 Rash and other nonspecific skin eruption: Secondary | ICD-10-CM

## 2023-08-26 DIAGNOSIS — Z0001 Encounter for general adult medical examination with abnormal findings: Secondary | ICD-10-CM | POA: Diagnosis not present

## 2023-08-26 DIAGNOSIS — Z23 Encounter for immunization: Secondary | ICD-10-CM

## 2023-08-26 DIAGNOSIS — Z1239 Encounter for other screening for malignant neoplasm of breast: Secondary | ICD-10-CM

## 2023-08-26 DIAGNOSIS — Z8719 Personal history of other diseases of the digestive system: Secondary | ICD-10-CM

## 2023-08-26 DIAGNOSIS — Z86718 Personal history of other venous thrombosis and embolism: Secondary | ICD-10-CM

## 2023-08-26 DIAGNOSIS — D649 Anemia, unspecified: Secondary | ICD-10-CM

## 2023-08-26 DIAGNOSIS — Z7189 Other specified counseling: Secondary | ICD-10-CM

## 2023-08-26 DIAGNOSIS — E2839 Other primary ovarian failure: Secondary | ICD-10-CM

## 2023-08-26 DIAGNOSIS — Z1231 Encounter for screening mammogram for malignant neoplasm of breast: Secondary | ICD-10-CM

## 2023-08-26 DIAGNOSIS — Z0000 Encounter for general adult medical examination without abnormal findings: Secondary | ICD-10-CM

## 2023-08-26 MED ORDER — TRIAMCINOLONE ACETONIDE 0.1 % MT PSTE: PASTE | OROMUCOSAL | 1 refills | Status: AC

## 2023-08-26 MED ORDER — TRIAMCINOLONE ACETONIDE 0.1 % EX CREA: 1.0000 | TOPICAL_CREAM | Freq: Two times a day (BID) | CUTANEOUS | 1 refills | Status: AC

## 2023-08-26 NOTE — Patient Instructions (Addendum)
You can call for a mammogram and bone density at the [facility] [address] [place] [address] [place]  Flu shot today.  Take care.  Glad to see you.  Try triamcinolone cream and use knee high compression stockings.

## 2023-08-26 NOTE — Progress Notes (Unsigned)
I have personally reviewed the Medicare Annual Wellness questionnaire and have noted 1. The patient's medical and social history 2. Their use of alcohol, tobacco or illicit drugs 3. Their current medications and supplements 4. The patient's functional ability including ADL's, fall risks, home safety risks and hearing or visual             impairment. 5. Diet and physical activities 6. Evidence for depression or mood disorders  The patients weight, height, BMI have been recorded in the chart and visual acuity is per eye clinic.  I have made referrals, counseling and provided education to the patient based review of the above and I have provided the pt with a written personalized care plan for preventive services.  Provider list updated- see scanned forms.  Routine anticipatory guidance given to patient.  See health maintenance. The possibility exists that previously documented standard health maintenance information may have been brought forward from a previous encounter into this note.  If needed, that same information has been updated to reflect the current situation based on today's encounter.    Flu Shingles PNA Tetanus Colon  Breast cancer screening Prostate cancer screening Advance directive Cognitive function addressed- see scanned forms- and if abnormal then additional documentation follows.   In addition to [facility], follow up visit for the below conditions:  She has nocturnal leg cramps w/o claudication.    Single event of DVT in the past. Still on aspirin.    Medial lower shin irritation.  No help with hydrocortisone.    Rx sent for TAC paste for oral lesions- occ noted but no sx now.  Mild anemia.  At baseline.    PMH and SH reviewed  Meds, vitals, and allergies reviewed.   ROS: Per HPI.  Unless specifically indicated otherwise in HPI, the patient denies:  General: fever. Eyes: acute vision changes ENT: sore throat Cardiovascular: chest  pain Respiratory: SOB GI: vomiting GU: dysuria Musculoskeletal: acute back pain Derm: acute rash Neuro: acute motor dysfunction Psych: worsening mood Endocrine: polydipsia Heme: bleeding Allergy: hayfever  GEN: nad, alert and oriented HEENT: mucous membranes moist NECK: supple w/o LA CV: rrr. PULM: ctab, no inc wob ABD: soft, +bs EXT: trace BLE edema SKIN: no acute rash but medial lower shin irritation

## 2023-08-28 DIAGNOSIS — Z8719 Personal history of other diseases of the digestive system: Secondary | ICD-10-CM | POA: Insufficient documentation

## 2023-08-28 NOTE — Assessment & Plan Note (Signed)
Discussed observation given her longstanding stability

## 2023-08-28 NOTE — Assessment & Plan Note (Signed)
Advance directive- husband designated if patient were incapacitated.

## 2023-08-28 NOTE — Assessment & Plan Note (Signed)
No lesions currently.  Prescription sent to use triamcinolone paste in case she has return of symptoms in the future.

## 2023-08-28 NOTE — Assessment & Plan Note (Signed)
Flu encouraged. Shingles encouraged, had Zostavax previously. PNA previously done Tetanus 2019. RSV vaccine discussed with patient. COVID-vaccine discussed with patient. Colon cancer screening not due given her age. Breast cancer screening ordered 2024 Bone density test ordered 2024. Advance directive-husband designated if patient were incapacitated. Cognitive function addressed- see scanned forms- and if abnormal then additional documentation follows.

## 2023-08-28 NOTE — Assessment & Plan Note (Signed)
Would continue aspirin for now.

## 2023-08-28 NOTE — Assessment & Plan Note (Signed)
Can use triamcinolone cream.  Reasonable to use compression stockings during the day.  Update me as needed.

## 2023-08-29 ENCOUNTER — Encounter: Payer: Self-pay | Admitting: Family Medicine

## 2023-08-31 ENCOUNTER — Ambulatory Visit (INDEPENDENT_AMBULATORY_CARE_PROVIDER_SITE_OTHER)
Admission: RE | Admit: 2023-08-31 | Discharge: 2023-08-31 | Disposition: A | Discharge status: Home / Self Care | Payer: Medicare Other | Source: Ambulatory Visit | Attending: Family Medicine | Admitting: Family Medicine

## 2023-08-31 DIAGNOSIS — E2839 Other primary ovarian failure: Secondary | ICD-10-CM | POA: Diagnosis not present

## 2023-09-04 ENCOUNTER — Encounter: Payer: Self-pay | Admitting: Family Medicine

## 2023-09-16 ENCOUNTER — Ambulatory Visit (INDEPENDENT_AMBULATORY_CARE_PROVIDER_SITE_OTHER): Payer: Medicare Other | Admitting: Family Medicine

## 2023-09-16 ENCOUNTER — Encounter: Payer: Self-pay | Admitting: Family Medicine

## 2023-09-16 VITALS — BP 140/72 | HR 86 | Temp 98.1°F | Ht 66.0 in | Wt 150.2 lb

## 2023-09-16 DIAGNOSIS — M810 Age-related osteoporosis without current pathological fracture: Secondary | ICD-10-CM | POA: Diagnosis not present

## 2023-09-16 NOTE — Progress Notes (Signed)
D/w pt about DXA results and osteoporosis path/phys in general, including vit D and calcium.  Reasonable to consider treatment with bisphosphonate, ie Actonel versus Prolia.  She had taken Actonel for 10 years in the distant past.  She has been off medication for many years in the meantime.  Vitamin D is normal.  Creatinine is normal. .  D/w pt about risk benefit, especially GI sx, jaw and long bone pathology.    Meds, vitals, and allergies reviewed.   ROS: Per HPI unless specifically indicated in ROS section   Nad Ncat Neck supple, no LA Rrr Ctab

## 2023-09-16 NOTE — Patient Instructions (Addendum)
Consider Actonel vs prolia.   Update me as needed.  Keep taking vitamin D as is. Take care.  Glad to see you.

## 2023-09-18 NOTE — Assessment & Plan Note (Signed)
Consider Actonel vs prolia.  Discussed pros and cons of both. Keep taking vitamin D as is.She'll consider and update me as needed.

## 2023-09-19 ENCOUNTER — Ambulatory Visit
Admission: RE | Admit: 2023-09-19 | Discharge: 2023-09-19 | Disposition: A | Discharge status: Home / Self Care | Payer: Medicare Other | Source: Ambulatory Visit | Attending: Family Medicine | Admitting: Family Medicine

## 2023-09-19 DIAGNOSIS — Z1231 Encounter for screening mammogram for malignant neoplasm of breast: Secondary | ICD-10-CM

## 2023-09-19 DIAGNOSIS — Z1239 Encounter for other screening for malignant neoplasm of breast: Secondary | ICD-10-CM

## 2023-12-05 ENCOUNTER — Ambulatory Visit (INDEPENDENT_AMBULATORY_CARE_PROVIDER_SITE_OTHER): Payer: Medicare Other | Admitting: Student

## 2023-12-05 ENCOUNTER — Ambulatory Visit (HOSPITAL_BASED_OUTPATIENT_CLINIC_OR_DEPARTMENT_OTHER): Payer: Medicare Other

## 2023-12-05 ENCOUNTER — Encounter (HOSPITAL_BASED_OUTPATIENT_CLINIC_OR_DEPARTMENT_OTHER): Payer: Self-pay | Admitting: Student

## 2023-12-05 DIAGNOSIS — M542 Cervicalgia: Secondary | ICD-10-CM | POA: Diagnosis not present

## 2023-12-05 DIAGNOSIS — M25511 Pain in right shoulder: Secondary | ICD-10-CM

## 2023-12-05 DIAGNOSIS — M47812 Spondylosis without myelopathy or radiculopathy, cervical region: Secondary | ICD-10-CM | POA: Diagnosis not present

## 2023-12-05 MED ORDER — TRIAMCINOLONE ACETONIDE 40 MG/ML IJ SUSP
2.0000 mL | INTRAMUSCULAR | Status: AC | PRN
  Administered 2023-12-05: 2 mL via INTRA_ARTICULAR

## 2023-12-05 MED ORDER — LIDOCAINE HCL 1 % IJ SOLN
4.0000 mL | INTRAMUSCULAR | Status: AC | PRN
  Administered 2023-12-05: 4 mL

## 2023-12-05 NOTE — Progress Notes (Signed)
Chief Complaint: Right shoulder pain     History of Present Illness:    [name] is a 77 y.o. female here today for evaluation of pain in her right shoulder.  This began about 3 weeks ago with no known injury or cause.  Pain is located over the front of the shoulder and travels down the lateral humerus and up towards the neck.  Rates pain today at a 7/10 which worsens with range of motion.  Sometimes has tingling going down into the hand although this is typically positional and correlates with her elbow going to sleep.  She is right-hand dominant.  She has tried taking ibuprofen and Tylenol.   Surgical History:   None  PMH/PSH/Family History/Social History/Meds/Allergies:    Past Medical History:  Diagnosis Date   Anemia    hx   Arthritis    History of DVT (deep vein thrombosis)    on hormones    Left inguinal hernia October 2012   Lupus anticoagulant positive    Past Surgical History:  Procedure Laterality Date   FOOT SURGERY  09/21/2005, 2009, 2011   both feet   INGUINAL HERNIA REPAIR  11/05/2011   Procedure: HERNIA REPAIR INGUINAL ADULT;  Surgeon: Wilmon Arms. Corliss Skains, MD;  Location: [facility] OR;  Service: General;  Laterality: Left;  LEFT INGUINAL HERNIA REPAIR WITH MESH   TUBAL LIGATION     81   Social History   Socioeconomic History   Marital status: Married    Spouse name: Not on file   Number of children: Not on file   Years of education: Not on file   Highest education level: Bachelor's degree (e.g., BA, AB, BS)  Occupational History   Not on file  Tobacco Use   Smoking status: Never   Smokeless tobacco: Never  Substance and Sexual Activity   Alcohol use: Yes    Alcohol/week: 3.0 - 4.0 standard drinks of alcohol    Types: 3 - 4 Glasses of wine per week    Comment: 4 glasses of wine weekly   Drug use: No   Sexual activity: Yes    Birth control/protection: Post-menopausal  Other Topics Concern   Not on file   Social History Narrative   Nonsmoker     Lives in [place]   Married, 1973   3 kids, local   Retired from [employer] 2016, Technical brewer   [employer] fan   Social Drivers of Health   Financial Resource Strain: Low Risk  (08/22/2023)   Overall Financial Resource Strain (CARDIA)    Difficulty of Paying Living Expenses: Not hard at all  Food Insecurity: No Food Insecurity (08/22/2023)   Hunger Vital Sign    Worried About Running Out of Food in the Last Year: Never true    Ran Out of Food in the Last Year: Never true  Transportation Needs: No Transportation Needs (08/22/2023)   PRAPARE - Administrator, Civil Service (Medical): No    Lack of Transportation (Non-Medical): No  Physical Activity: Unknown (08/22/2023)   Exercise Vital Sign    Days of Exercise per Week: 0 days    Minutes of Exercise per Session: Not on file  Stress: No Stress Concern Present (08/22/2023)   [employer] of Occupational Health - Occupational Stress Questionnaire    Feeling  of Stress : Only a little  Social Connections: Socially Integrated (08/22/2023)   Social Connection and Isolation Panel [NHANES]    Frequency of Communication with Friends and Family: More than three times a week    Frequency of Social Gatherings with Friends and Family: Twice a week    Attends Religious Services: 1 to 4 times per year    Active Member of [employer] or Organizations: Yes    Attends Engineer, structural: More than 4 times per year    Marital Status: Married   Family History  Problem Relation Age of Onset   Heart disease Father    Colon cancer Neg Hx    Breast cancer Neg Hx    Allergies  Allergen Reactions   Amoxicillin-Pot Clavulanate     Leg cramps   Ciprofloxacin     Leg cramps   Hydroxychloroquine Sulfate     Facial hives   Current Outpatient Medications  Medication Sig Dispense Refill   aspirin EC 81 MG tablet Take 81 mg by mouth daily.       calcium carbonate (OS-CAL) 600 MG TABS  Take 600 mg by mouth 2 (two) times daily with a meal.       magnesium oxide (MAG-OX) 400 MG tablet Take 400 mg by mouth daily.     Multiple Vitamin (MULTIVITAMIN) tablet Take 1 tablet by mouth daily.       triamcinolone (KENALOG) 0.1 % paste Place on mouth ulcer twice daily until heals 5 g 1   triamcinolone cream (KENALOG) 0.1 % Apply 1 Application topically 2 (two) times daily. 30 g 1   No current facility-administered medications for this visit.   Facility-Administered Medications Ordered in Other Visits  Medication Dose Route Frequency Provider Last Rate Last Admin   ceFAZolin (ANCEF) IVPB 2 g/50 mL premix  2 g Intravenous 60 min Pre-Op Manus Rudd, MD       No results found.  Review of Systems:   A ROS was performed including pertinent positives and negatives as documented in the HPI.  Physical Exam :   Constitutional: NAD and appears stated age Neurological: Alert and oriented Psych: Appropriate affect and cooperative There were no vitals taken for this visit.   Comprehensive Musculoskeletal Exam:    Right shoulder exam demonstrates tenderness over the anterior glenohumeral joint.  Active range of motion to 160 degrees forward flexion, 60 degrees external rotation, and internal rotation to L1 bilaterally.  Negative impingement signs and empty can test.  No tenderness throughout the cervical spine, paraspinal muscles, or upper trapezius.  Slight limitation in cervical rotation to the left otherwise full ROM without pain.  Grip strength 5/5 bilaterally.  Imaging:   Xray (right shoulder 3 views): Mild glenohumeral osteoarthritis.  Otherwise negative.  Xray (cervical spine 4 views): Decreased C5-C6 intervertebral disc spacing.  Otherwise normal alignment and adequate spacing throughout.   I personally reviewed and interpreted the radiographs.   Assessment:   77 y.o. female with 3-week history of right shoulder pain.  X-rays taken today do demonstrate some evidence of some  glenohumeral osteoarthritis which I believe is a likely contributor to her pain.  She does retain full range of motion and strength which is reassuring for any significant rotator cuff pathology.  She also reports some pain up toward the neck which could be muscular in nature however unable to rule out nerve compression as she does have some degenerative changes at C5-C6.  Will begin treatment today to target her shoulder pain  and continue monitoring the issues in her neck.  Patient is agreeable to a cortisone injection in her right shoulder.  Glenohumeral cortisone injection was performed today under ultrasound guidance without any complication.  Will plan to have her return as needed.  Plan :    - Right shoulder cortisone injection performed today - Return to clinic as needed    Procedure Note  Patient: [name]             Date of Birth: January 14, 1946           MRN: [id number]             Visit Date: 12/05/2023  Procedures: Visit Diagnoses:  1. Acute pain of right shoulder     Large Joint Inj: R glenohumeral on 12/05/2023 12:17 PM Indications: pain Details: 22 G 1.5 in needle, ultrasound-guided anterior approach Medications: 4 mL lidocaine 1 %; 2 mL triamcinolone acetonide 40 MG/ML Outcome: tolerated well, no immediate complications Procedure, treatment alternatives, risks and benefits explained, specific risks discussed. Consent was given by the patient. Immediately prior to procedure a time out was called to verify the correct patient, procedure, equipment, support staff and site/side marked as required. Patient was prepped and draped in the usual sterile fashion.      I personally saw and evaluated the patient, and participated in the management and treatment plan.  Hazle Nordmann, PA-C Orthopedics

## 2024-08-09 ENCOUNTER — Ambulatory Visit

## 2024-10-08 ENCOUNTER — Encounter: Payer: Self-pay | Admitting: Radiology

## 2024-10-26 ENCOUNTER — Encounter: Payer: Self-pay | Admitting: Family Medicine

## 2024-10-26 ENCOUNTER — Ambulatory Visit: Admitting: Family Medicine

## 2024-10-26 VITALS — BP 130/82 | HR 71 | Temp 98.2°F | Ht 66.0 in | Wt 150.2 lb

## 2024-10-26 DIAGNOSIS — R8290 Unspecified abnormal findings in urine: Secondary | ICD-10-CM | POA: Diagnosis not present

## 2024-10-26 DIAGNOSIS — L821 Other seborrheic keratosis: Secondary | ICD-10-CM | POA: Diagnosis not present

## 2024-10-26 LAB — POC URINALSYSI DIPSTICK (AUTOMATED)
Bilirubin, UA: NEGATIVE
Blood, UA: NEGATIVE
Glucose, UA: NEGATIVE
Ketones, UA: NEGATIVE
Nitrite, UA: POSITIVE
Protein, UA: NEGATIVE
Spec Grav, UA: <=1.005 — AB (ref 1.010–1.025)
Urobilinogen, UA: 0.2 U/dL
pH, UA: 7.5 (ref 5.0–8.0)

## 2024-10-26 MED ORDER — SULFAMETHOXAZOLE-TRIMETHOPRIM 800-160 MG PO TABS: 1.0000 | ORAL_TABLET | Freq: Two times a day (BID) | ORAL | 0 refills | Status: AC

## 2024-10-26 NOTE — Progress Notes (Unsigned)
 Her cramps improved with drinking more water at night.    dysuria: no burning but change in urine odor.   duration of symptoms: going on for weeks.   abdominal pain:no fevers:no back pain:some lower back pain, which could be from baseline scoliosis.   vomiting: no  Meds, vitals, and allergies reviewed.   Per HPI unless specifically indicated in ROS section   GEN: nad, alert and oriented HEENT: mucous membranes moist NECK: supple CV: rrr.  PULM: ctab, no inc wob ABD: soft, +bs, suprapubic area tender EXT: no edema SKIN: no acute rash BACK: no CVA pain

## 2024-10-26 NOTE — Patient Instructions (Signed)
Drink plenty of water and start the antibiotics today.  We'll contact you with your lab report.  Take care.   

## 2024-10-28 ENCOUNTER — Ambulatory Visit: Payer: Self-pay | Admitting: Family Medicine

## 2024-10-28 LAB — URINE CULTURE
MICRO NUMBER:: 17268325
SPECIMEN QUALITY:: ADEQUATE

## 2024-10-28 MED ORDER — NITROFURANTOIN MONOHYD MACRO 100 MG PO CAPS: 100.0000 mg | ORAL_CAPSULE | Freq: Two times a day (BID) | ORAL | 0 refills | Status: AC

## 2024-10-28 NOTE — Assessment & Plan Note (Signed)
 Concern for UTI.  Start Septra .  See notes on labs.  Drink plenty fluids.  Update me as needed.  Okay for outpatient follow-up.

## 2024-10-28 NOTE — Assessment & Plan Note (Signed)
 Not ulcerated.  Would not intervene.  She can update me as needed.

## 2024-10-30 ENCOUNTER — Ambulatory Visit: Admitting: Family Medicine

## 2025-03-07 ENCOUNTER — Ambulatory Visit
